# Patient Record
Sex: Male | Born: 1960 | Race: White | Hispanic: No | Marital: Married | Smoking: Former smoker
Health system: Southern US, Community
[De-identification: ages and names within clinical notes are randomized; demographics above are authoritative.]

## PROBLEM LIST (undated history)

## (undated) DIAGNOSIS — K219 Gastro-esophageal reflux disease without esophagitis: Secondary | ICD-10-CM

## (undated) DIAGNOSIS — E785 Hyperlipidemia, unspecified: Secondary | ICD-10-CM

## (undated) DIAGNOSIS — F419 Anxiety disorder, unspecified: Secondary | ICD-10-CM

## (undated) DIAGNOSIS — E559 Vitamin D deficiency, unspecified: Secondary | ICD-10-CM

## (undated) DIAGNOSIS — R0989 Other specified symptoms and signs involving the circulatory and respiratory systems: Secondary | ICD-10-CM

## (undated) DIAGNOSIS — T7840XA Allergy, unspecified, initial encounter: Secondary | ICD-10-CM

## (undated) HISTORY — DX: Other specified symptoms and signs involving the circulatory and respiratory systems: R09.89

## (undated) HISTORY — DX: Allergy, unspecified, initial encounter: T78.40XA

## (undated) HISTORY — PX: HEMORRHOID BANDING: SHX5850

## (undated) HISTORY — DX: Gastro-esophageal reflux disease without esophagitis: K21.9

## (undated) HISTORY — DX: Anxiety disorder, unspecified: F41.9

## (undated) HISTORY — DX: Vitamin D deficiency, unspecified: E55.9

## (undated) HISTORY — DX: Hyperlipidemia, unspecified: E78.5

---

## 2001-10-03 ENCOUNTER — Ambulatory Visit (HOSPITAL_COMMUNITY): Admission: AD | Admit: 2001-10-03 | Discharge: 2001-10-03 | Payer: Self-pay | Admitting: Internal Medicine

## 2001-10-03 ENCOUNTER — Encounter: Payer: Self-pay | Admitting: Internal Medicine

## 2006-08-05 ENCOUNTER — Encounter: Admission: RE | Admit: 2006-08-05 | Discharge: 2006-11-03 | Payer: Self-pay | Admitting: Internal Medicine

## 2007-04-14 HISTORY — PX: LAPAROSCOPIC GASTRIC BANDING: SHX1100

## 2009-01-22 ENCOUNTER — Emergency Department (HOSPITAL_COMMUNITY): Admission: EM | Admit: 2009-01-22 | Discharge: 2009-01-22 | Payer: Self-pay | Admitting: Emergency Medicine

## 2011-08-04 ENCOUNTER — Ambulatory Visit (HOSPITAL_COMMUNITY)
Admission: RE | Admit: 2011-08-04 | Discharge: 2011-08-04 | Disposition: A | Discharge status: Home / Self Care | Payer: Commercial Managed Care - PPO | Source: Ambulatory Visit | Attending: Internal Medicine | Admitting: Internal Medicine

## 2011-08-04 ENCOUNTER — Other Ambulatory Visit (HOSPITAL_COMMUNITY): Payer: Self-pay | Admitting: Internal Medicine

## 2011-08-04 DIAGNOSIS — Z0000 Encounter for general adult medical examination without abnormal findings: Secondary | ICD-10-CM | POA: Insufficient documentation

## 2011-08-04 DIAGNOSIS — R05 Cough: Secondary | ICD-10-CM

## 2011-08-04 DIAGNOSIS — R059 Cough, unspecified: Secondary | ICD-10-CM | POA: Insufficient documentation

## 2011-09-12 HISTORY — PX: HEMORRHOID BANDING: SHX5850

## 2013-04-18 ENCOUNTER — Ambulatory Visit: Payer: Self-pay | Admitting: Emergency Medicine

## 2013-05-18 ENCOUNTER — Encounter: Payer: Self-pay | Admitting: Emergency Medicine

## 2013-05-18 ENCOUNTER — Ambulatory Visit (INDEPENDENT_AMBULATORY_CARE_PROVIDER_SITE_OTHER): Payer: Commercial Managed Care - PPO | Admitting: Emergency Medicine

## 2013-05-18 VITALS — BP 118/80 | HR 68 | Temp 98.0°F | Resp 16 | Ht 72.0 in | Wt 250.0 lb

## 2013-05-18 DIAGNOSIS — E785 Hyperlipidemia, unspecified: Secondary | ICD-10-CM

## 2013-05-18 DIAGNOSIS — M654 Radial styloid tenosynovitis [de Quervain]: Secondary | ICD-10-CM

## 2013-05-18 DIAGNOSIS — Z79899 Other long term (current) drug therapy: Secondary | ICD-10-CM

## 2013-05-18 DIAGNOSIS — E782 Mixed hyperlipidemia: Secondary | ICD-10-CM

## 2013-05-18 DIAGNOSIS — J309 Allergic rhinitis, unspecified: Secondary | ICD-10-CM

## 2013-05-18 LAB — CBC WITH DIFFERENTIAL/PLATELET
Basophils Absolute: 0 10*3/uL (ref 0.0–0.1)
Basophils Relative: 0 % (ref 0–1)
Eosinophils Absolute: 0.2 10*3/uL (ref 0.0–0.7)
Eosinophils Relative: 2 % (ref 0–5)
HCT: 41.5 % (ref 39.0–52.0)
Hemoglobin: 14.5 g/dL (ref 13.0–17.0)
Lymphocytes Relative: 33 % (ref 12–46)
Lymphs Abs: 2.6 10*3/uL (ref 0.7–4.0)
MCH: 29.4 pg (ref 26.0–34.0)
MCHC: 34.9 g/dL (ref 30.0–36.0)
MCV: 84.2 fL (ref 78.0–100.0)
Monocytes Absolute: 0.5 10*3/uL (ref 0.1–1.0)
Monocytes Relative: 6 % (ref 3–12)
Neutro Abs: 4.6 10*3/uL (ref 1.7–7.7)
Neutrophils Relative %: 59 % (ref 43–77)
Platelets: 210 10*3/uL (ref 150–400)
RBC: 4.93 MIL/uL (ref 4.22–5.81)
RDW: 14 % (ref 11.5–15.5)
WBC: 8 10*3/uL (ref 4.0–10.5)

## 2013-05-18 LAB — LIPID PANEL
CHOL/HDL RATIO: 2.8 ratio
Cholesterol: 169 mg/dL (ref 0–200)
HDL: 61 mg/dL (ref >39)
LDL Cholesterol: 96 mg/dL (ref 0–99)
Triglycerides: 60 mg/dL (ref <150)
VLDL: 12 mg/dL (ref 0–40)

## 2013-05-18 LAB — BASIC METABOLIC PANEL WITH GFR
BUN: 17 mg/dL (ref 6–23)
CO2: 29 mEq/L (ref 19–32)
Calcium: 10 mg/dL (ref 8.4–10.5)
Chloride: 102 mEq/L (ref 96–112)
Creat: 0.83 mg/dL (ref 0.50–1.35)
GFR, Est African American: >89 mL/min
GFR, Est Non African American: >89 mL/min
Glucose, Bld: 81 mg/dL (ref 70–99)
Potassium: 4.7 mEq/L (ref 3.5–5.3)
Sodium: 142 mEq/L (ref 135–145)

## 2013-05-18 LAB — HEPATIC FUNCTION PANEL
ALT: 36 U/L (ref 0–53)
AST: 26 U/L (ref 0–37)
Albumin: 4.6 g/dL (ref 3.5–5.2)
Alkaline Phosphatase: 41 U/L (ref 39–117)
Bilirubin, Direct: 0.1 mg/dL (ref 0.0–0.3)
Indirect Bilirubin: 0.4 mg/dL (ref 0.2–1.2)
Total Bilirubin: 0.5 mg/dL (ref 0.2–1.2)
Total Protein: 6.9 g/dL (ref 6.0–8.3)

## 2013-05-18 NOTE — Patient Instructions (Signed)
De Quervain's Disease De Quervain's disease is a condition often seen in racquet sports where there is a soreness (inflammation) in the cord like structures (tendons) which attach muscle to bone on the thumb side of the wrist. There may be a tightening of the tissuesaround the tendons. This condition is often helped by giving up or modifying the activity which caused it. When conservative treatment does not help, surgery may be required. Conservative treatment could include changes in the activity which brought about the problem or made it worse. Anti-inflammatory medications and injections may be used to help decrease the inflammation and help with pain control. Your caregiver will help you determine which is best for you. DIAGNOSIS  Often the diagnosis (learning what is wrong) can be made by examination. Sometimes x-rays are required. HOME CARE INSTRUCTIONS   Apply ice to the sore area for 15-20 minutes, 03-04 times per day while awake. Put the ice in a plastic bag and place a towel between the bag of ice and your skin. This is especially helpful if it can be done after all activities involving the sore wrist.  Temporary splinting may help.  Only take over-the-counter or prescription medicines for pain, discomfort or fever as directed by your caregiver. SEEK MEDICAL CARE IF:   Pain relief is not obtained with medications, or if you have increasing pain and seem to be getting worse rather than better. MAKE SURE YOU:   Understand these instructions.  Will watch your condition.  Will get help right away if you are not doing well or get worse. Document Released: 12/23/2000 Document Revised: 06/22/2011 Document Reviewed: 03/30/2005 [employer] Patient Information 2014 [employer].  

## 2013-05-18 NOTE — Progress Notes (Addendum)
Subjective:    Patient ID: Johnathan Sullivan, male    DOB: 1961/04/10, 53 y.o.   MRN: [id number]  HPI Comments: 53 yo male cholesterol recheck. LAST LABS T 184 L 115  D 83 A1C 5.2  He is exercising routinely. He has been eating smaller portions and improving diet.   He has had sniffles x 3 weeks. He is taking Zyrtec QD.   He notes left wrist has been more painful. He notes weakness and sharp pain on/off with out trigger.   Hyperlipidemia     Medication List       This list is accurate as of: 05/18/13 11:59 PM.  Always use your most recent med list.               aspirin 81 MG tablet  Take 81 mg by mouth daily.     Biotin 5000 MCG Caps  Take 5,000 mcg by mouth daily.     cetirizine 10 MG tablet  Commonly known as:  ZYRTEC  Take 10 mg by mouth daily.     Fish Oil 1200 MG Caps  Take 2,400 mg by mouth 2 (two) times daily.     glucosamine-chondroitin 500-400 MG tablet  Take 2 tablets by mouth daily.     Magnesium 400 MG Caps  Take 2,000 mg by mouth daily.     multivitamin tablet  Take 1 tablet by mouth daily.     RED YEAST RICE PO  Take by mouth daily.     Vitamin D3 5000 UNITS Caps  Take 5,000 Units by mouth daily.       ALLERGIES No Known Allergies    Past Medical History  Diagnosis Date  . Hyperlipidemia   . Unspecified vitamin D deficiency      Review of Systems  HENT: Positive for postnasal drip.   All other systems reviewed and are negative.   BP 118/80  Pulse 68  Temp(Src) 98 F (36.7 C) (Temporal)  Resp 16  Ht 6' (1.829 m)  Wt 250 lb (113.399 kg)  BMI 33.90 kg/m2     Objective:   Physical Exam  Nursing note and vitals reviewed. Constitutional: He is oriented to person, place, and time. He appears well-developed and well-nourished.  HENT:  Head: Normocephalic and atraumatic.  Right Ear: External ear normal.  Left Ear: External ear normal.  Nose: Nose normal.  Mouth/Throat: No oropharyngeal exudate.  Eyes: Conjunctivae and EOM are  normal.  Neck: Normal range of motion. Neck supple. No JVD present. No thyromegaly present.  Cardiovascular: Normal rate, regular rhythm, normal heart sounds and intact distal pulses.   Pulmonary/Chest: Effort normal and breath sounds normal.  Abdominal: Soft. Bowel sounds are normal. He exhibits no distension and no mass. There is no tenderness. There is no rebound and no guarding.  Musculoskeletal: Normal range of motion. He exhibits no edema and no tenderness.  + pain with thumb tuck extension  Lymphadenopathy:    He has no cervical adenopathy.  Neurological: He is alert and oriented to person, place, and time. He has normal reflexes. No cranial nerve deficit. Coordination normal.  Skin: Skin is warm and dry.  Psychiatric: He has a normal mood and affect. His behavior is normal. Judgment and thought content normal.          Assessment & Plan:  1. Cholesterol- recheck labs, Need to eat healthier and exercise AD. 2. Allergic rhinitis- Switch to Allegra OTC, increase H2o, allergy hygiene explained. 3. Berline Lopes Disease-  Declines steroid, ice, and can try wrist splint, w/c if SX increase. Voltaren gel SX #1 use AD

## 2013-05-21 ENCOUNTER — Encounter: Payer: Self-pay | Admitting: Emergency Medicine

## 2013-05-21 DIAGNOSIS — E559 Vitamin D deficiency, unspecified: Secondary | ICD-10-CM | POA: Insufficient documentation

## 2013-05-21 DIAGNOSIS — E785 Hyperlipidemia, unspecified: Secondary | ICD-10-CM | POA: Insufficient documentation

## 2013-08-08 ENCOUNTER — Encounter: Payer: Self-pay | Admitting: Emergency Medicine

## 2013-08-08 ENCOUNTER — Ambulatory Visit (INDEPENDENT_AMBULATORY_CARE_PROVIDER_SITE_OTHER): Payer: Commercial Managed Care - PPO | Admitting: Emergency Medicine

## 2013-08-08 VITALS — BP 124/82 | HR 68 | Temp 98.0°F | Resp 18 | Ht 71.5 in | Wt 247.0 lb

## 2013-08-08 DIAGNOSIS — Z0000 Encounter for general adult medical examination without abnormal findings: Secondary | ICD-10-CM

## 2013-08-08 DIAGNOSIS — Z1212 Encounter for screening for malignant neoplasm of rectum: Secondary | ICD-10-CM

## 2013-08-08 DIAGNOSIS — E782 Mixed hyperlipidemia: Secondary | ICD-10-CM

## 2013-08-08 DIAGNOSIS — M25532 Pain in left wrist: Secondary | ICD-10-CM

## 2013-08-08 DIAGNOSIS — Z111 Encounter for screening for respiratory tuberculosis: Secondary | ICD-10-CM

## 2013-08-08 DIAGNOSIS — E559 Vitamin D deficiency, unspecified: Secondary | ICD-10-CM

## 2013-08-08 DIAGNOSIS — E785 Hyperlipidemia, unspecified: Secondary | ICD-10-CM

## 2013-08-08 DIAGNOSIS — I10 Essential (primary) hypertension: Secondary | ICD-10-CM

## 2013-08-08 LAB — CBC WITH DIFFERENTIAL/PLATELET
Basophils Absolute: 0 10*3/uL (ref 0.0–0.1)
Basophils Relative: 0 % (ref 0–1)
EOS ABS: 0.2 10*3/uL (ref 0.0–0.7)
Eosinophils Relative: 3 % (ref 0–5)
HEMATOCRIT: 42 % (ref 39.0–52.0)
Hemoglobin: 14.8 g/dL (ref 13.0–17.0)
Lymphocytes Relative: 31 % (ref 12–46)
Lymphs Abs: 2.5 10*3/uL (ref 0.7–4.0)
MCH: 30.3 pg (ref 26.0–34.0)
MCHC: 35.2 g/dL (ref 30.0–36.0)
MCV: 85.9 fL (ref 78.0–100.0)
Monocytes Absolute: 0.4 10*3/uL (ref 0.1–1.0)
Monocytes Relative: 5 % (ref 3–12)
Neutro Abs: 4.9 10*3/uL (ref 1.7–7.7)
Neutrophils Relative %: 61 % (ref 43–77)
Platelets: 230 10*3/uL (ref 150–400)
RBC: 4.89 MIL/uL (ref 4.22–5.81)
RDW: 13.7 % (ref 11.5–15.5)
WBC: 8 10*3/uL (ref 4.0–10.5)

## 2013-08-08 LAB — HEMOGLOBIN A1C
HEMOGLOBIN A1C: 5.3 % (ref <5.7)
MEAN PLASMA GLUCOSE: 105 mg/dL (ref <117)

## 2013-08-08 NOTE — Progress Notes (Signed)
Subjective:    Patient ID: Johnathan Sullivan, male    DOB: May 28, 1960, 53 y.o.   MRN: [id number]  HPI Comments: 53 yo overweight WM for CPE and 3 month Cholesterol/ Vit D f/u. He has not been exercising routinely but keeps busy. He has not been eating as healthy. He is otherwise doing well.   Last labs  CHOL         169   05/18/2013 HDL           61   05/18/2013 LDLCALC       96   05/18/2013 TRIG          60   05/18/2013 CHOLHDL      2.8   05/18/2013 ALT           36   05/18/2013 AST           26   05/18/2013 ALKPHOS       41   05/18/2013 BILITOT      0.5   05/18/2013 CREATININE     0.83   05/18/2013 BUN              17   05/18/2013 NA              142   05/18/2013 K               4.7   05/18/2013 CL              102   05/18/2013 CO2              29   05/18/2013 WBC      8.0   05/18/2013 HGB     14.5   05/18/2013 HCT     41.5   05/18/2013 MCV     84.2   05/18/2013 PLT      210   05/18/2013 VIT D 83 A1C 5.2 TESTOSTERONE 308 PSA .79  Left wrist still with discomfort weakness. He notes splint made symptoms worse, voltaren helped some. He notes on/off discomfort usually with weather and certain positions.     Medication List       This list is accurate as of: 08/08/13 11:59 PM.  Always use your most recent med list.               aspirin 81 MG tablet  Take 81 mg by mouth daily.     Biotin 5000 MCG Caps  Take 5,000 mcg by mouth daily.     cetirizine 10 MG tablet  Commonly known as:  ZYRTEC  Take 10 mg by mouth daily.     Fish Oil 1200 MG Caps  Take 2,400 mg by mouth 2 (two) times daily.     glucosamine-chondroitin 500-400 MG tablet  Take 2 tablets by mouth daily.     Magnesium 400 MG Caps  Take 2,000 mg by mouth daily.     multivitamin tablet  Take 1 tablet by mouth daily.     RED YEAST RICE PO  Take by mouth daily.     Vitamin D3 5000 UNITS Caps  Take 5,000 Units by mouth daily.      MAINTENANCE: Colonoscopy:2013 EYE:3/14 Dentist:Q 6 month, 3/ 15  CXR:07/2011  IMMUNIZATIONS: Tdap:  08/09/11 Influenza: 2014  Patient Care Team: Lucky CowboyWilliam McKeown, MD as PCP - General (Internal Medicine) Elmon ElseKaren Gould, MD as Consulting Physician (Dermatology) Davina PokePeter K Dunn (Optometry) Griffith CitronJeffrey R Medoff, MD as Consulting Physician (Gastroenterology) Judene CompanionAlfred B Little, MD as  Attending Physician (Cardiology) Nedra HaiLee, (Dentist)    Review of Systems  Musculoskeletal: Positive for arthralgias and joint swelling.  All other systems reviewed and are negative.  BP 124/82  Pulse 68  Temp(Src) 98 F (36.7 C) (Temporal)  Resp 18  Ht 5' 11.5" (1.816 m)  Wt 247 lb (112.038 kg)  BMI 33.97 kg/m2     Objective:   Physical Exam  Nursing note and vitals reviewed. Constitutional: He is oriented to person, place, and time. He appears well-developed and well-nourished.  Overweight  HENT:  Head: Normocephalic and atraumatic.  Right Ear: External ear normal.  Left Ear: External ear normal.  Nose: Nose normal.  Mouth/Throat: Oropharynx is clear and moist.  Eyes: Conjunctivae and EOM are normal. Pupils are equal, round, and reactive to light. Right eye exhibits no discharge. Left eye exhibits no discharge. No scleral icterus.  Neck: Normal range of motion. Neck supple. No JVD present. No tracheal deviation present. No thyromegaly present.  Cardiovascular: Normal rate, regular rhythm, normal heart sounds and intact distal pulses.   Varicose veins bil LE- NT soft, 1 + edema  Pulmonary/Chest: Effort normal and breath sounds normal.  Abdominal: Soft. Bowel sounds are normal. He exhibits no distension and no mass. There is no tenderness. There is no rebound and no guarding.  Genitourinary: Rectum normal and prostate normal. Guaiac negative stool.  External NT hemorrhoid  Musculoskeletal: Normal range of motion. He exhibits no edema and no tenderness.  + pain left wrist/ thumb with lateral flexion  Lymphadenopathy:    He has no cervical adenopathy.  Neurological: He is alert and oriented to person, place, and  time. He has normal reflexes. No cranial nerve deficit. He exhibits normal muscle tone. Coordination normal.  Skin: Skin is warm and dry. No rash noted. No erythema. No pallor.  Psychiatric: He has a normal mood and affect. His behavior is normal. Judgment and thought content normal.      AORTA SCAN WNL EKG NSCSPT     Assessment & Plan:  1. CPE- Update screening labs/ History/ Immunizations/ Testing as needed. Advised healthy diet, QD exercise, increase H20 and continue RX/ Vitamins AD.  2. Varicose veins- advise support hose, elevation, increase activity, call with any concerns .  3. Overweight/ Vit D Def./Cholesterol- recheck labs, Need to eat healthier and exercise AD.  4. Left wrist pain- ref ORTHO, R.I.C.E call with any concerns

## 2013-08-08 NOTE — Patient Instructions (Addendum)
Wrist Pain A wrist sprain happens when the bands of tissue that hold the wrist joints together (ligament) stretch too much or tear. A wrist strain happens when muscles or bands of tissue that connect muscles to bones (tendons) are stretched or pulled. HOME CARE  Put ice on the injured area.  Put ice in a plastic bag.  Place a towel between your skin and the bag.  Leave the ice on for 15-20 minutes, 03-04 times a day, for the first 2 days.  Raise (elevate) the injured wrist to lessen puffiness (swelling).  Rest the injured wrist for at least 48 hours or as told by your doctor.  Wear a splint, cast, or an elastic wrap as told by your doctor.  Only take medicine as told by your doctor.  Follow up with your doctor as told. This is important. GET HELP RIGHT AWAY IF:   The fingers are puffy, very red, white, or cold and blue.  The fingers lose feeling (numb) or tingle.  The pain gets worse.  It is hard to move the fingers. MAKE SURE YOU:   Understand these instructions.  Will watch your condition.  Will get help right away if you are not doing well or get worse. Document Released: 09/16/2007 Document Revised: 06/22/2011 Document Reviewed: 05/21/2010 [facility] LtdExitCare Patient Information 2014 [place] LakeExitCare, [place]LL[place]. De Quervain's Disease Suzette BattiestDe Quervain's disease is a condition often seen in racquet sports where there is a soreness (inflammation) in the cord like structures (tendons) which attach muscle to bone on the thumb side of the wrist. There may be a tightening of the tissuesaround the tendons. This condition is often helped by giving up or modifying the activity which caused it. When conservative treatment does not help, surgery may be required. Conservative treatment could include changes in the activity which brought about the problem or made it worse. Anti-inflammatory medications and injections may be used to help decrease the inflammation and help with pain control. Your caregiver will help  you determine which is best for you. DIAGNOSIS  Often the diagnosis (learning what is wrong) can be made by examination. Sometimes x-rays are required. HOME CARE INSTRUCTIONS   Apply ice to the sore area for 15-20 minutes, 03-04 times per day while awake. Put the ice in a plastic bag and place a towel between the bag of ice and your skin. This is especially helpful if it can be done after all activities involving the sore wrist.  Temporary splinting may help.  Only take over-the-counter or prescription medicines for pain, discomfort or fever as directed by your caregiver. SEEK MEDICAL CARE IF:   Pain relief is not obtained with medications, or if you have increasing pain and seem to be getting worse rather than better. MAKE SURE YOU:   Understand these instructions.  Will watch your condition.  Will get help right away if you are not doing well or get worse. Document Released: 12/23/2000 Document Revised: 06/22/2011 Document Reviewed: 03/30/2005 [facility]Exit[facility] Patient Information 2014 [place]Care, [place]LL[place].

## 2013-08-09 LAB — TESTOSTERONE: Testosterone: 362 ng/dL (ref 300–890)

## 2013-08-09 LAB — HEPATIC FUNCTION PANEL
ALBUMIN: 4.5 g/dL (ref 3.5–5.2)
ALK PHOS: 41 U/L (ref 39–117)
ALT: 28 U/L (ref 0–53)
AST: 22 U/L (ref 0–37)
BILIRUBIN TOTAL: 0.5 mg/dL (ref 0.2–1.2)
Bilirubin, Direct: 0.1 mg/dL (ref 0.0–0.3)
Indirect Bilirubin: 0.4 mg/dL (ref 0.2–1.2)
Total Protein: 6.9 g/dL (ref 6.0–8.3)

## 2013-08-09 LAB — LIPID PANEL
CHOLESTEROL: 151 mg/dL (ref 0–200)
HDL: 57 mg/dL (ref >39)
LDL Cholesterol: 82 mg/dL (ref 0–99)
Total CHOL/HDL Ratio: 2.6 Ratio
Triglycerides: 62 mg/dL (ref <150)
VLDL: 12 mg/dL (ref 0–40)

## 2013-08-09 LAB — VITAMIN D 25 HYDROXY (VIT D DEFICIENCY, FRACTURES): Vit D, 25-Hydroxy: 74 ng/mL (ref 30–89)

## 2013-08-09 LAB — BASIC METABOLIC PANEL WITH GFR
BUN: 15 mg/dL (ref 6–23)
CHLORIDE: 102 meq/L (ref 96–112)
CO2: 28 mEq/L (ref 19–32)
Calcium: 9.6 mg/dL (ref 8.4–10.5)
Creat: 0.97 mg/dL (ref 0.50–1.35)
GFR, Est African American: >89 mL/min
GFR, Est Non African American: 89 mL/min
Glucose, Bld: 83 mg/dL (ref 70–99)
Potassium: 4.4 mEq/L (ref 3.5–5.3)
SODIUM: 140 meq/L (ref 135–145)

## 2013-08-09 LAB — URINALYSIS, ROUTINE W REFLEX MICROSCOPIC
Bilirubin Urine: NEGATIVE
Glucose, UA: NEGATIVE mg/dL
Hgb urine dipstick: NEGATIVE
KETONES UR: NEGATIVE mg/dL
Leukocytes, UA: NEGATIVE
Nitrite: NEGATIVE
PROTEIN: NEGATIVE mg/dL
SPECIFIC GRAVITY, URINE: 1.009 (ref 1.005–1.030)
UROBILINOGEN UA: 0.2 mg/dL (ref 0.0–1.0)
pH: 7 (ref 5.0–8.0)

## 2013-08-09 LAB — INSULIN, FASTING: Insulin fasting, serum: 12 u[IU]/mL (ref 3–28)

## 2013-08-09 LAB — MAGNESIUM: MAGNESIUM: 1.9 mg/dL (ref 1.5–2.5)

## 2013-08-09 LAB — TSH: TSH: 1.118 u[IU]/mL (ref 0.350–4.500)

## 2013-08-09 LAB — MICROALBUMIN / CREATININE URINE RATIO
CREATININE, URINE: 75.7 mg/dL
Microalb Creat Ratio: 6.6 mg/g (ref 0.0–30.0)
Microalb, Ur: 0.5 mg/dL (ref 0.00–1.89)

## 2013-08-09 LAB — PSA: PSA: 0.91 ng/mL (ref <=4.00)

## 2013-08-10 LAB — TB SKIN TEST
INDURATION: 0 mm
TB Skin Test: NEGATIVE

## 2013-08-24 ENCOUNTER — Encounter: Payer: Self-pay | Admitting: Emergency Medicine

## 2013-09-19 DIAGNOSIS — R0989 Other specified symptoms and signs involving the circulatory and respiratory systems: Secondary | ICD-10-CM | POA: Insufficient documentation

## 2013-09-19 DIAGNOSIS — T7840XA Allergy, unspecified, initial encounter: Secondary | ICD-10-CM | POA: Insufficient documentation

## 2013-10-25 ENCOUNTER — Encounter: Payer: Self-pay | Admitting: Emergency Medicine

## 2014-02-01 ENCOUNTER — Ambulatory Visit (INDEPENDENT_AMBULATORY_CARE_PROVIDER_SITE_OTHER): Payer: Commercial Managed Care - PPO

## 2014-02-01 VITALS — Temp 98.1°F

## 2014-02-01 DIAGNOSIS — Z23 Encounter for immunization: Secondary | ICD-10-CM

## 2014-04-02 ENCOUNTER — Ambulatory Visit (HOSPITAL_COMMUNITY)
Admission: RE | Admit: 2014-04-02 | Discharge: 2014-04-02 | Disposition: A | Discharge status: Home / Self Care | Payer: Commercial Managed Care - PPO | Source: Ambulatory Visit | Attending: Physician Assistant | Admitting: Physician Assistant

## 2014-04-02 ENCOUNTER — Ambulatory Visit (INDEPENDENT_AMBULATORY_CARE_PROVIDER_SITE_OTHER): Payer: Commercial Managed Care - PPO | Admitting: Physician Assistant

## 2014-04-02 ENCOUNTER — Encounter: Payer: Self-pay | Admitting: Physician Assistant

## 2014-04-02 VITALS — BP 126/80 | HR 68 | Temp 98.0°F | Resp 18 | Ht 72.0 in | Wt 252.0 lb

## 2014-04-02 DIAGNOSIS — R05 Cough: Secondary | ICD-10-CM | POA: Diagnosis not present

## 2014-04-02 DIAGNOSIS — R059 Cough, unspecified: Secondary | ICD-10-CM

## 2014-04-02 DIAGNOSIS — R0989 Other specified symptoms and signs involving the circulatory and respiratory systems: Secondary | ICD-10-CM | POA: Insufficient documentation

## 2014-04-02 DIAGNOSIS — J209 Acute bronchitis, unspecified: Secondary | ICD-10-CM

## 2014-04-02 MED ORDER — AZITHROMYCIN 250 MG PO TABS: ORAL_TABLET | ORAL | Status: AC

## 2014-04-02 MED ORDER — ALBUTEROL SULFATE 108 (90 BASE) MCG/ACT IN AEPB: 2.0000 | INHALATION_SPRAY | Freq: Four times a day (QID) | RESPIRATORY_TRACT | Status: DC | PRN

## 2014-04-02 MED ORDER — PROMETHAZINE-CODEINE 6.25-10 MG/5ML PO SYRP: 5.0000 mL | ORAL_SOLUTION | Freq: Four times a day (QID) | ORAL | Status: DC | PRN

## 2014-04-02 MED ORDER — PREDNISONE 20 MG PO TABS: ORAL_TABLET | ORAL | Status: AC

## 2014-04-02 NOTE — Progress Notes (Signed)
Subjective:    Patient ID: Guss Bundehorn W Cull, male    DOB: [birth year], 53 y.o.   MRN: [id number]  Cough This is a new problem. Episode onset: Last Wednesday. The problem has been unchanged. The problem occurs constantly. Cough characteristics: was yellow-green earlier, but now non-productive. Associated symptoms include chills, headaches, rhinorrhea and wheezing. Pertinent negatives include no chest pain, ear pain, fever, postnasal drip, rash, sore throat or shortness of breath. The symptoms are aggravated by lying down. Risk factors: Former Smoker- Quit 05/18/93- 2 ppd since Age teenager (About 15 years total smoking). Treatments tried: Delsym and cold medicines. The treatment provided no relief.  GFR= 89 on 08/08/13 Review of Systems  Constitutional: Positive for chills and diaphoresis. Negative for fever and fatigue.  HENT: Positive for congestion, rhinorrhea and sinus pressure. Negative for ear discharge, ear pain, postnasal drip, sore throat and trouble swallowing.   Eyes: Negative.   Respiratory: Positive for cough, chest tightness and wheezing. Negative for shortness of breath.   Cardiovascular: Negative.  Negative for chest pain.  Gastrointestinal: Negative.   Musculoskeletal:       Muscle Aches  Skin: Negative.  Negative for rash.  Neurological: Positive for headaches. Negative for dizziness and light-headedness.   Past Medical History  Diagnosis Date  . Hyperlipidemia   . Unspecified vitamin D deficiency   . Labile hypertension   . Allergy    Current Outpatient Prescriptions on File Prior to Visit  Medication Sig Dispense Refill  . aspirin 81 MG tablet Take 81 mg by mouth daily.    . Biotin 5000 MCG CAPS Take 5,000 mcg by mouth daily.    . cetirizine (ZYRTEC) 10 MG tablet Take 10 mg by mouth daily.    . Cholecalciferol (VITAMIN D3) 5000 UNITS CAPS Take 5,000 Units by mouth daily.    Marland Kitchen. glucosamine-chondroitin 500-400 MG tablet Take 2 tablets by mouth daily.    . Magnesium 400 MG  CAPS Take 2,000 mg by mouth daily.    . Multiple Vitamin (MULTIVITAMIN) tablet Take 1 tablet by mouth daily.    . Omega-3 Fatty Acids (FISH OIL) 1200 MG CAPS Take 2,400 mg by mouth 2 (two) times daily.    . Red Yeast Rice Extract (RED YEAST RICE PO) Take by mouth daily.     No current facility-administered medications on file prior to visit.   No Known Allergies    BP 126/80 mmHg  Pulse 68  Temp(Src) 98 F (36.7 C) (Temporal)  Resp 18  Ht 6' (1.829 m)  Wt 252 lb (114.306 kg)  BMI 34.17 kg/m2  SpO2 97% Wt Readings from Last 3 Encounters:  04/02/14 252 lb (114.306 kg)  08/08/13 247 lb (112.038 kg)  05/18/13 250 lb (113.399 kg)   Objective:   Physical Exam  Constitutional: He is oriented to person, place, and time. He appears well-developed and well-nourished. He has a sickly appearance. No distress.  HENT:  Head: Normocephalic.  Right Ear: Tympanic membrane, external ear and ear canal normal.  Left Ear: Tympanic membrane, external ear and ear canal normal.  Nose: Nose normal. Right sinus exhibits no maxillary sinus tenderness and no frontal sinus tenderness. Left sinus exhibits no maxillary sinus tenderness and no frontal sinus tenderness.  Mouth/Throat: Uvula is midline and mucous membranes are normal. Mucous membranes are not pale and not dry. No trismus in the jaw. No uvula swelling. Posterior oropharyngeal erythema present. No oropharyngeal exudate, posterior oropharyngeal edema or tonsillar abscesses.  Eyes: Conjunctivae and lids are normal. Pupils  are equal, round, and reactive to light. Right eye exhibits no discharge. Left eye exhibits no discharge. No scleral icterus.  Neck: Trachea normal, normal range of motion and phonation normal. Neck supple. No tracheal tenderness present. No tracheal deviation present.  Cardiovascular: Normal rate, regular rhythm, S1 normal, S2 normal, normal heart sounds and normal pulses.  Exam reveals no gallop, no distant heart sounds and no  friction rub.   No murmur heard. Pulmonary/Chest: Effort normal. No stridor. No respiratory distress. He has no decreased breath sounds. He has wheezes. He has no rhonchi. He has rales in the right middle field, the right lower field and the left middle field. He exhibits no tenderness.  Wheezing throughout all lung fields.   Abdominal: Soft. Bowel sounds are normal. There is no tenderness. There is no rebound and no guarding.  Lymphadenopathy:  No tenderness or LAD.  Neurological: He is alert and oriented to person, place, and time. Gait normal.  Skin: Skin is warm, dry and intact. No rash noted. He is not diaphoretic.  Psychiatric: He has a normal mood and affect. His speech is normal and behavior is normal. Judgment and thought content normal. Cognition and memory are normal.  Vitals reviewed.  Assessment & Plan:  1. Acute bronchitis, unspecified organism -Take Z-Pak as prescribed- azithromycin (ZITHROMAX Z-PAK) 250 MG tablet; Take 2 tablets PO on day 1, then take 1 tablet PO QDaily for 4 days.  Dispense: 6 tablet; Refill: 1 -Take Prednisone as prescribed for inflammation- predniSONE (DELTASONE) 20 MG tablet; Take 3 tablets PO QDaily for 3 days, then take 2 tablets PO QDaily for 3 days, then take 1 tablet PO QDaily for 3 days  Dispense: 18 tablet; Refill: 0 - Take Promethazine-Codeine as prescribed for cough- promethazine-codeine (PHENERGAN WITH CODEINE) 6.25-10 MG/5ML syrup; Take 5 mLs by mouth every 6 (six) hours as needed for cough. Max: 20mL per day  Dispense: 240 mL; Refill: 0 -Take Proair Respiclick as prescribed for wheezing- Coupon given - Albuterol Sulfate (PROAIR RESPICLICK) 108 (90 BASE) MCG/ACT AEPB; Inhale 2 puffs into the lungs every 6 (six) hours as needed.  Dispense: 1 each; Refill: 1  2. Cough Ordered x-ray to R/O Pneumonia - DG Chest 2 View; Future  Discussed medication effects and SE's.  Pt agreed to treatment plan. If you are not feeling better in 10-14 days, then  please call the office. Please follow up in 1 month for regular follow up.  Addendum: Chest x-ray showed "The lungs are mildly hyperinflated without evidence of pneumonia nor CHF nor other acute cardiopulmonary abnormality."  "There is mild stable tortuosity of the descending thoracic aorta. There is mild disc space narrowing in the lower thoracic spine."  Will Monitor.  Please continue medications as prescribed at visit.  Mearl Olver, Lise AuerJennifer L, PA-C 9:06 AM [facility]

## 2014-04-02 NOTE — Patient Instructions (Addendum)
-   Rest and stay hydrated.  Make sure you drink plenty of fluids to make sure urine is clear when you urinate.  Water will help thin out mucous. -Take Z-Pak as prescribed with 1 refill. -Take Prednisone as prescribed for inflammation. -Take Promethazine-codeine as prescribed for cough. Take Proair Respiclick for wheezing as prescribed. -[facility]  If you have pneumonia, please follow up in 1 week. If you are not feeling better in 10-14 days, then please call the office.  Please schedule regular follow up in 1 month.  Please call the office or message through My Chart if you have any questions.   Acute Bronchitis Bronchitis is when the airways that extend from the windpipe into the lungs get red, puffy, and painful (inflamed). Bronchitis often causes thick spit (mucus) to develop. This leads to a cough. A cough is the most common symptom of bronchitis. In acute bronchitis, the condition usually begins suddenly and goes away over time (usually in 2 weeks). Smoking, allergies, and asthma can make bronchitis worse. Repeated episodes of bronchitis may cause more lung problems.  Most common cause of Bronchitis is viruses (rhinovirus, coronavirus, RSV).  Therefore, not requiring an antibiotic; as antibiotics only treat bacterial infections.  HOME CARE  Rest.  Drink enough fluids to keep your pee (urine) clear or pale yellow (unless you need to limit fluids as told by your doctor).  Only take over-the-counter or prescription medicines as told by your doctor.  Avoid smoking and secondhand smoke. These can make bronchitis worse. If you are a smoker, think about using nicotine gum or skin patches. Quitting smoking will help your lungs heal faster.  Reduce the chance of getting bronchitis again by:  Washing your hands often.  Avoiding people with cold symptoms.  Trying not to touch your hands to your mouth, nose, or eyes.  Follow up with your doctor as  told. GET HELP IF: Your symptoms do not improve after 1 week of treatment. Symptoms include:  Cough.  Fever.  Coughing up thick spit.  Body aches.  Chest congestion.  Chills.  Shortness of breath.  Sore throat. GET HELP RIGHT AWAY IF:   You have an increased fever.  You have chills.  You have severe shortness of breath.  You have bloody thick spit (sputum).  You throw up (vomit) often.  You lose too much body fluid (dehydration).  You have a severe headache.  You faint. MAKE SURE YOU:   Understand these instructions.  Will watch your condition.  Will get help right away if you are not doing well or get worse. Document Released: 09/16/2007 Document Revised: 11/30/2012 Document Reviewed: 09/20/2012 [facility]Exit[facility] Patient Information 2015 [place]ExitCare, [place]LLC. This information is not intended to replace advice given to you by your health care provider. Make sure you discuss any questions you have with your health care provider.

## 2014-05-08 ENCOUNTER — Encounter: Payer: Self-pay | Admitting: Internal Medicine

## 2014-05-15 ENCOUNTER — Ambulatory Visit: Payer: Self-pay | Admitting: Internal Medicine

## 2014-08-16 ENCOUNTER — Encounter: Payer: Self-pay | Admitting: Emergency Medicine

## 2014-08-29 ENCOUNTER — Encounter: Payer: Self-pay | Admitting: Internal Medicine

## 2014-09-18 ENCOUNTER — Encounter: Payer: Self-pay | Admitting: Internal Medicine

## 2014-09-18 ENCOUNTER — Ambulatory Visit (INDEPENDENT_AMBULATORY_CARE_PROVIDER_SITE_OTHER): Payer: Commercial Managed Care - PPO | Admitting: Internal Medicine

## 2014-09-18 VITALS — BP 108/72 | HR 54 | Temp 98.6°F | Resp 18 | Ht 71.5 in | Wt 258.0 lb

## 2014-09-18 DIAGNOSIS — R7309 Other abnormal glucose: Secondary | ICD-10-CM

## 2014-09-18 DIAGNOSIS — I10 Essential (primary) hypertension: Secondary | ICD-10-CM

## 2014-09-18 DIAGNOSIS — Z0001 Encounter for general adult medical examination with abnormal findings: Secondary | ICD-10-CM

## 2014-09-18 DIAGNOSIS — Z79899 Other long term (current) drug therapy: Secondary | ICD-10-CM | POA: Diagnosis not present

## 2014-09-18 DIAGNOSIS — E785 Hyperlipidemia, unspecified: Secondary | ICD-10-CM | POA: Diagnosis not present

## 2014-09-18 DIAGNOSIS — R5383 Other fatigue: Secondary | ICD-10-CM

## 2014-09-18 DIAGNOSIS — R0989 Other specified symptoms and signs involving the circulatory and respiratory systems: Secondary | ICD-10-CM

## 2014-09-18 DIAGNOSIS — Z1212 Encounter for screening for malignant neoplasm of rectum: Secondary | ICD-10-CM

## 2014-09-18 DIAGNOSIS — R6889 Other general symptoms and signs: Secondary | ICD-10-CM

## 2014-09-18 DIAGNOSIS — M25532 Pain in left wrist: Secondary | ICD-10-CM | POA: Diagnosis not present

## 2014-09-18 DIAGNOSIS — E559 Vitamin D deficiency, unspecified: Secondary | ICD-10-CM | POA: Diagnosis not present

## 2014-09-18 DIAGNOSIS — Z125 Encounter for screening for malignant neoplasm of prostate: Secondary | ICD-10-CM | POA: Diagnosis not present

## 2014-09-18 LAB — CBC WITH DIFFERENTIAL/PLATELET
BASOS ABS: 0.1 10*3/uL (ref 0.0–0.1)
Basophils Relative: 1 % (ref 0–1)
Eosinophils Absolute: 0.3 10*3/uL (ref 0.0–0.7)
Eosinophils Relative: 4 % (ref 0–5)
HCT: 45.4 % (ref 39.0–52.0)
Hemoglobin: 15.5 g/dL (ref 13.0–17.0)
Lymphocytes Relative: 27 % (ref 12–46)
Lymphs Abs: 2.1 10*3/uL (ref 0.7–4.0)
MCH: 29.9 pg (ref 26.0–34.0)
MCHC: 34.1 g/dL (ref 30.0–36.0)
MCV: 87.5 fL (ref 78.0–100.0)
MPV: 10.6 fL (ref 8.6–12.4)
Monocytes Absolute: 0.5 10*3/uL (ref 0.1–1.0)
Monocytes Relative: 6 % (ref 3–12)
NEUTROS PCT: 62 % (ref 43–77)
Neutro Abs: 4.7 10*3/uL (ref 1.7–7.7)
PLATELETS: 224 10*3/uL (ref 150–400)
RBC: 5.19 MIL/uL (ref 4.22–5.81)
RDW: 13.8 % (ref 11.5–15.5)
WBC: 7.6 10*3/uL (ref 4.0–10.5)

## 2014-09-18 LAB — MAGNESIUM: MAGNESIUM: 2 mg/dL (ref 1.5–2.5)

## 2014-09-18 LAB — HEPATIC FUNCTION PANEL
ALT: 22 U/L (ref 0–53)
AST: 19 U/L (ref 0–37)
Albumin: 4.2 g/dL (ref 3.5–5.2)
Alkaline Phosphatase: 52 U/L (ref 39–117)
BILIRUBIN TOTAL: 0.4 mg/dL (ref 0.2–1.2)
Bilirubin, Direct: 0.1 mg/dL (ref 0.0–0.3)
Indirect Bilirubin: 0.3 mg/dL (ref 0.2–1.2)
TOTAL PROTEIN: 7.1 g/dL (ref 6.0–8.3)

## 2014-09-18 LAB — BASIC METABOLIC PANEL WITH GFR
BUN: 14 mg/dL (ref 6–23)
CHLORIDE: 103 meq/L (ref 96–112)
CO2: 28 meq/L (ref 19–32)
Calcium: 9.8 mg/dL (ref 8.4–10.5)
Creat: 0.88 mg/dL (ref 0.50–1.35)
GFR, Est Non African American: >89 mL/min
GLUCOSE: 88 mg/dL (ref 70–99)
Potassium: 4.6 mEq/L (ref 3.5–5.3)
SODIUM: 139 meq/L (ref 135–145)

## 2014-09-18 LAB — IRON AND TIBC
%SAT: 16 % — ABNORMAL LOW (ref 20–55)
Iron: 62 ug/dL (ref 42–165)
TIBC: 378 ug/dL (ref 215–435)
UIBC: 316 ug/dL (ref 125–400)

## 2014-09-18 LAB — LIPID PANEL
CHOL/HDL RATIO: 3.4 ratio
CHOLESTEROL: 180 mg/dL (ref 0–200)
HDL: 53 mg/dL (ref >=40)
LDL CALC: 114 mg/dL — AB (ref 0–99)
Triglycerides: 63 mg/dL (ref <150)
VLDL: 13 mg/dL (ref 0–40)

## 2014-09-18 LAB — VITAMIN B12: VITAMIN B 12: 783 pg/mL (ref 211–911)

## 2014-09-18 LAB — HEMOGLOBIN A1C
Hgb A1c MFr Bld: 5.3 % (ref <5.7)
MEAN PLASMA GLUCOSE: 105 mg/dL (ref <117)

## 2014-09-18 LAB — TSH: TSH: 0.787 u[IU]/mL (ref 0.350–4.500)

## 2014-09-18 MED ORDER — MELOXICAM 15 MG PO TABS: 15.0000 mg | ORAL_TABLET | Freq: Every day | ORAL | Status: DC

## 2014-09-18 NOTE — Progress Notes (Signed)
Patient ID: ALVY ALSOP, male   DOB: [birth year], 54 y.o.   MRN: [id number]  Complete Physical  Assessment and Plan:   1. Labile hypertension -cont to watch at home -diet and exercise - Urinalysis, Routine w reflex microscopic (not at [facility]) - Microalbumin / creatinine urine ratio - EKG 12-Lead  2. Vitamin D deficiency -cont supplement - Vit D  25 hydroxy (rtn osteoporosis monitoring)  3. Hyperlipidemia -check today.  If elevated consider red yeast rice - Lipid panel  4. Elevated random blood glucose level  - Hemoglobin A1c - Insulin, random  5. Screening for rectal cancer  - POC Hemoccult Bld/Stl (3-Cd Home Screen); Future  6. Prostate cancer screening  - PSA  7. Other fatigue  - Iron and TIBC - Vitamin B12 - Testosterone - TSH  8. Medication management  - CBC with Differential/Platelet - BASIC METABOLIC PANEL WITH GFR - Hepatic function panel - Magnesium  9. Left wrist pain -CMC arthiritis vs dequervains tenosynovitis - meloxicam (MOBIC) 15 MG tablet; Take 1 tablet (15 mg total) by mouth daily.  Dispense: 30 tablet; Refill: 1 - Ambulatory referral to Orthopedics    Discussed med's effects and SE's. Screening labs and tests as requested with regular follow-up as recommended.  HPI Patient presents for a complete physical.   His blood pressure has been controlled at home, today their BP is BP: 108/72 mmHg He does not workout. He denies chest pain, shortness of breath, dizziness.   He is not on cholesterol medication and denies myalgias. His cholesterol is not at goal. The cholesterol last visit was:   Lab Results  Component Value Date   CHOL 151 08/08/2013   HDL 57 08/08/2013   LDLCALC 82 08/08/2013   TRIG 62 08/08/2013   CHOLHDL 2.6 08/08/2013    He has not been working on diet and exercise for prediabetes, he is on bASA, he is not on ACE/ARB and denies foot ulcerations, hyperglycemia, hypoglycemia , increased appetite, nausea, paresthesia of the  feet, polydipsia, polyuria, visual disturbances, vomiting and weight loss. Last A1C in the office was:  Lab Results  Component Value Date   HGBA1C 5.3 08/08/2013    Patient is on Vitamin D supplement.   Lab Results  Component Value Date   VD25OH 74 08/08/2013      Last PSA was: Lab Results  Component Value Date   PSA 0.91 08/08/2013  .  Denies BPH symptoms daytime frequency, double voiding, dysuria, hematuria, hesitancy, incontinence, intermittency, nocturia, sensation of incomplete bladder emptying, suprapubic pain, urgency or weak urinary stream.  Patient complains of some left hand pain that has been going on for a year.  He reports that it is worse with turning the handle and also with opening jars and gripping objects.  He was supposed to see a hand Dr. But never heard anything from our office.  He did try wearing a wrist splint.  He does occasionally take medication to help with his hand like advil which helps a little bit.  He is right hand dominant.      Current Medications:  Current Outpatient Prescriptions on File Prior to Visit  Medication Sig Dispense Refill  . aspirin 81 MG tablet Take 81 mg by mouth daily.    . cetirizine (ZYRTEC) 10 MG tablet Take 10 mg by mouth daily.    . Cholecalciferol (VITAMIN D3) 5000 UNITS CAPS Take 5,000 Units by mouth daily.    . Multiple Vitamin (MULTIVITAMIN) tablet Take 1 tablet by mouth daily.  No current facility-administered medications on file prior to visit.    Health Maintenance:  Immunization History  Administered Date(s) Administered  . Influenza Split 01/04/2013, 02/01/2014  . PPD Test 08/08/2013  . Tdap 08/04/2011    Patient Care Team: Lucky Cowboy, MD as PCP - General (Internal Medicine) Elmon Else, MD as Consulting Physician (Dermatology) Davina Poke (Optometry) Sharrell Ku, MD as Consulting Physician (Gastroenterology) Judene Companion, MD as Attending Physician (Cardiology)  Allergies: No Known  Allergies  Medical History:  Past Medical History  Diagnosis Date  . Hyperlipidemia   . Unspecified vitamin D deficiency   . Labile hypertension   . Allergy     Surgical History:  Past Surgical History  Procedure Laterality Date  . Laparoscopic gastric banding  2009  . Hemorrhoid banding  09/2011    Family History:  Family History  Problem Relation Age of Onset  . Cancer Mother     Breast  . Hypertension Mother   . Hypertension Father   . Heart attack Father   . Arthritis Father     Social History:   History  Substance Use Topics  . Smoking status: Former Smoker -- 2.00 packs/day for 15 years    Quit date: 05/18/1993  . Smokeless tobacco: Not on file  . Alcohol Use: No    Review of Systems:  Review of Systems  Constitutional: Negative for fever, chills and malaise/fatigue.  HENT: Negative for congestion, ear pain and sore throat.   Eyes: Negative for blurred vision and double vision.  Respiratory: Negative for cough, shortness of breath and wheezing.   Cardiovascular: Negative for chest pain, palpitations and leg swelling.  Gastrointestinal: Negative for heartburn, nausea, vomiting, diarrhea, constipation, blood in stool and melena.  Genitourinary: Negative.   Skin: Negative.   Neurological: Negative for dizziness, sensory change, loss of consciousness and headaches.  Psychiatric/Behavioral: Negative for depression. The patient is not nervous/anxious and does not have insomnia.     Physical Exam: Estimated body mass index is 35.49 kg/(m^2) as calculated from the following:   Height as of this encounter: 5' 11.5" (1.816 m).   Weight as of this encounter: 258 lb (117.028 kg). BP 108/72 mmHg  Pulse 54  Temp(Src) 98.6 F (37 C) (Temporal)  Resp 18  Ht 5' 11.5" (1.816 m)  Wt 258 lb (117.028 kg)  BMI 35.49 kg/m2  General Appearance: Well nourished, in no apparent distress.  Eyes: PERRLA, EOMs, conjunctiva no swelling or erythema ENT/Mouth: Ear canals clear  bilaterally with no erythema, swelling, discharge.  TMs normal bilaterally with no erythema, bulging, or retractions.  Oropharynx clear and moist with no exudate, swelling, or erythema.  Dentition normal.   Neck: Supple, thyroid normal. No bruits, JVD, cervical adenopathy Respiratory: Respiratory effort normal, BS equal bilaterally without rales, rhonchi, wheezing or stridor.  Cardio: RRR without murmurs, rubs or gallops. Brisk peripheral pulses without edema.  Chest: symmetric, with normal excursions Abdomen: Obese, Soft, nontender, no guarding, rebound, hernias, masses, or organomegaly. Genitourinary: deferred per patient Musculoskeletal: Full ROM all peripheral extremities,5/5 strength, and normal gait. There is tenderness to palpation of the MCP joint.  Mildly positive finklesteins test.  Full active and passive ROM.  Neurovascularly intact. Skin: Warm, dry without rashes, lesions, ecchymosis. Neuro: A&Ox3, Cranial nerves intact, reflexes equal bilaterally. Normal muscle tone, no cerebellar symptoms. Sensation intact.  Psych: Normal affect, Insight and Judgment appropriate.   EKG: WNL no changes.  Over 40 minutes of exam, counseling, chart review and critical decision making was  performed  FORCUCCI, Elon Lomeli 10:37 AM [facility]

## 2014-09-18 NOTE — Patient Instructions (Signed)
Preventive Care for Adults  A healthy lifestyle and preventive care can promote health and wellness. Preventive health guidelines for men include the following key practices:  A routine yearly physical is a good way to check with your health care provider about your health and preventative screening. It is a chance to share any concerns and updates on your health and to receive a thorough exam.  Visit your dentist for a routine exam and preventative care every 6 months. Brush your teeth twice a day and floss once a day. Good oral hygiene prevents tooth decay and gum disease.  The frequency of eye exams is based on your age, health, family medical history, use of contact lenses, and other factors. Follow your health care provider's recommendations for frequency of eye exams.  Eat a healthy diet. Foods such as vegetables, fruits, whole grains, low-fat dairy products, and lean protein foods contain the nutrients you need without too many calories. Decrease your intake of foods high in solid fats, added sugars, and salt. Eat the right amount of calories for you.Get information about a proper diet from your health care provider, if necessary.  Regular physical exercise is one of the most important things you can do for your health. Most adults should get at least 150 minutes of moderate-intensity exercise (any activity that increases your heart rate and causes you to sweat) each week. In addition, most adults need muscle-strengthening exercises on 2 or more days a week.  Maintain a healthy weight. The body mass index (BMI) is a screening tool to identify possible weight problems. It provides an estimate of body fat based on height and weight. Your health care provider can find your BMI and can help you achieve or maintain a healthy weight.For adults 20 years and older:  A BMI below 18.5 is considered underweight.  A BMI of 18.5 to 24.9 is normal.  A BMI of 25 to 29.9 is considered overweight.  A  BMI of 30 and above is considered obese.  Maintain normal blood lipids and cholesterol levels by exercising and minimizing your intake of saturated fat. Eat a balanced diet with plenty of fruit and vegetables. Blood tests for lipids and cholesterol should begin at age 20 and be repeated every 5 years. If your lipid or cholesterol levels are high, you are over 50, or you are at high risk for heart disease, you may need your cholesterol levels checked more frequently.Ongoing high lipid and cholesterol levels should be treated with medicines if diet and exercise are not working.  If you smoke, find out from your health care provider how to quit. If you do not use tobacco, do not start.  Lung cancer screening is recommended for adults aged 55-80 years who are at high risk for developing lung cancer because of a history of smoking. A yearly low-dose CT scan of the lungs is recommended for people who have at least a 30-pack-year history of smoking and are a current smoker or have quit within the past 15 years. A pack year of smoking is smoking an average of 1 pack of cigarettes a day for 1 year (for example: 1 pack a day for 30 years or 2 packs a day for 15 years). Yearly screening should continue until the smoker has stopped smoking for at least 15 years. Yearly screening should be stopped for people who develop a health problem that would prevent them from having lung cancer treatment.  If you choose to drink alcohol, do not have more   than 2 drinks per day. One drink is considered to be 12 ounces (355 mL) of beer, 5 ounces (148 mL) of wine, or 1.5 ounces (44 mL) of liquor.  Avoid use of street drugs. Do not share needles with anyone. Ask for help if you need support or instructions about stopping the use of drugs.  High blood pressure causes heart disease and increases the risk of stroke. Your blood pressure should be checked at least every 1-2 years. Ongoing high blood pressure should be treated with  medicines, if weight loss and exercise are not effective.  If you are 45-79 years old, ask your health care provider if you should take aspirin to prevent heart disease.  Diabetes screening involves taking a blood sample to check your fasting blood sugar level. This should be done once every 3 years, after age 45, if you are within normal weight and without risk factors for diabetes. Testing should be considered at a younger age or be carried out more frequently if you are overweight and have at least 1 risk factor for diabetes.  Colorectal cancer can be detected and often prevented. Most routine colorectal cancer screening begins at the age of 50 and continues through age 75. However, your health care provider may recommend screening at an earlier age if you have risk factors for colon cancer. On a yearly basis, your health care provider may provide home test kits to check for hidden blood in the stool. Use of a small camera at the end of a tube to directly examine the colon (sigmoidoscopy or colonoscopy) can detect the earliest forms of colorectal cancer. Talk to your health care provider about this at age 50, when routine screening begins. Direct exam of the colon should be repeated every 5-10 years through age 75, unless early forms of precancerous polyps or small growths are found.   Talk with your health care provider about prostate cancer screening.  Testicular cancer screening isrecommended for adult males. Screening includes self-exam, a health care provider exam, and other screening tests. Consult with your health care provider about any symptoms you have or any concerns you have about testicular cancer.  Use sunscreen. Apply sunscreen liberally and repeatedly throughout the day. You should seek shade when your shadow is shorter than you. Protect yourself by wearing long sleeves, pants, a wide-brimmed hat, and sunglasses year round, whenever you are outdoors.  Once a month, do a whole-body  skin exam, using a mirror to look at the skin on your back. Tell your health care provider about new moles, moles that have irregular borders, moles that are larger than a pencil eraser, or moles that have changed in shape or color.  Stay current with required vaccines (immunizations).  Influenza vaccine. All adults should be immunized every year.  Tetanus, diphtheria, and acellular pertussis (Td, Tdap) vaccine. An adult who has not previously received Tdap or who does not know his vaccine status should receive 1 dose of Tdap. This initial dose should be followed by tetanus and diphtheria toxoids (Td) booster doses every 10 years. Adults with an unknown or incomplete history of completing a 3-dose immunization series with Td-containing vaccines should begin or complete a primary immunization series including a Tdap dose. Adults should receive a Td booster every 10 years.  Varicella vaccine. An adult without evidence of immunity to varicella should receive 2 doses or a second dose if he has previously received 1 dose.  Human papillomavirus (HPV) vaccine. Males aged 13-21 years who have not   received the vaccine previously should receive the 3-dose series. Males aged 22-26 years may be immunized. Immunization is recommended through the age of 26 years for any male who has sex with males and did not get any or all doses earlier. Immunization is recommended for any person with an immunocompromised condition through the age of 26 years if he did not get any or all doses earlier. During the 3-dose series, the second dose should be obtained 4-8 weeks after the first dose. The third dose should be obtained 24 weeks after the first dose and 16 weeks after the second dose.  Zoster vaccine. One dose is recommended for adults aged 60 years or older unless certain conditions are present.    PREVNAR  - Pneumococcal 13-valent conjugate (PCV13) vaccine. When indicated, a person who is uncertain of his immunization  history and has no record of immunization should receive the PCV13 vaccine. An adult aged 19 years or older who has certain medical conditions and has not been previously immunized should receive 1 dose of PCV13 vaccine. This PCV13 should be followed with a dose of pneumococcal polysaccharide (PPSV23) vaccine. The PPSV23 vaccine dose should be obtained at least 8 weeks after the dose of PCV13 vaccine. An adult aged 19 years or older who has certain medical conditions and previously received 1 or more doses of PPSV23 vaccine should receive 1 dose of PCV13. The PCV13 vaccine dose should be obtained 1 or more years after the last PPSV23 vaccine dose.    PNEUMOVAX - Pneumococcal polysaccharide (PPSV23) vaccine. When PCV13 is also indicated, PCV13 should be obtained first. All adults aged 65 years and older should be immunized. An adult younger than age 65 years who has certain medical conditions should be immunized. Any person who resides in a nursing home or long-term care facility should be immunized. An adult smoker should be immunized. People with an immunocompromised condition and certain other conditions should receive both PCV13 and PPSV23 vaccines. People with human immunodeficiency virus (HIV) infection should be immunized as soon as possible after diagnosis. Immunization during chemotherapy or radiation therapy should be avoided. Routine use of PPSV23 vaccine is not recommended for American Indians, [place] Natives, or people younger than 65 years unless there are medical conditions that require PPSV23 vaccine. When indicated, people who have unknown immunization and have no record of immunization should receive PPSV23 vaccine. One-time revaccination 5 years after the first dose of PPSV23 is recommended for people aged 19-64 years who have chronic kidney failure, nephrotic syndrome, asplenia, or immunocompromised conditions. People who received 1-2 doses of PPSV23 before age 65 years should receive another  dose of PPSV23 vaccine at age 65 years or later if at least 5 years have passed since the previous dose. Doses of PPSV23 are not needed for people immunized with PPSV23 at or after age 65 years.    Hepatitis A vaccine. Adults who wish to be protected from this disease, have certain high-risk conditions, work with hepatitis A-infected animals, work in hepatitis A research labs, or travel to or work in countries with a high rate of hepatitis A should be immunized. Adults who were previously unvaccinated and who anticipate close contact with an international adoptee during the first 60 days after arrival in the [place] from a country with a high rate of hepatitis A should be immunized.    Hepatitis B vaccine. Adults should be immunized if they wish to be protected from this disease, have certain high-risk conditions, may be exposed to   blood or other infectious body fluids, are household contacts or sex partners of hepatitis B positive people, are clients or workers in certain care facilities, or travel to or work in countries with a high rate of hepatitis B.   Preventive Service / Frequency   Ages 40 to 64  Blood pressure check.  Lipid and cholesterol check  Lung cancer screening. / Every year if you are aged 55-80 years and have a 30-pack-year history of smoking and currently smoke or have quit within the past 15 years. Yearly screening is stopped once you have quit smoking for at least 15 years or develop a health problem that would prevent you from having lung cancer treatment.  Fecal occult blood test (FOBT) of stool. / Every year beginning at age 50 and continuing until age 75. You may not have to do this test if you get a colonoscopy every 10 years.  Flexible sigmoidoscopy** or colonoscopy.** / Every 5 years for a flexible sigmoidoscopy or every 10 years for a colonoscopy beginning at age 50 and continuing until age 75. Screening for abdominal aortic aneurysm (AAA)  by ultrasound is  recommended for people who have history of high blood pressure or who are current or former smokers.   

## 2014-09-19 LAB — URINALYSIS, ROUTINE W REFLEX MICROSCOPIC
BILIRUBIN URINE: NEGATIVE
GLUCOSE, UA: NEGATIVE mg/dL
Hgb urine dipstick: NEGATIVE
Ketones, ur: NEGATIVE mg/dL
LEUKOCYTES UA: NEGATIVE
Nitrite: NEGATIVE
PH: 7.5 (ref 5.0–8.0)
Protein, ur: NEGATIVE mg/dL
Urobilinogen, UA: 0.2 mg/dL (ref 0.0–1.0)

## 2014-09-19 LAB — MICROALBUMIN / CREATININE URINE RATIO
Creatinine, Urine: 83 mg/dL
MICROALB/CREAT RATIO: 2.4 mg/g (ref 0.0–30.0)
Microalb, Ur: 0.2 mg/dL (ref <2.0)

## 2014-09-19 LAB — VITAMIN D 25 HYDROXY (VIT D DEFICIENCY, FRACTURES): VIT D 25 HYDROXY: 70 ng/mL (ref 30–100)

## 2014-09-19 LAB — PSA: PSA: 0.87 ng/mL (ref <=4.00)

## 2014-09-19 LAB — INSULIN, RANDOM: Insulin: 4.8 u[IU]/mL (ref 2.0–19.6)

## 2014-09-19 LAB — TESTOSTERONE: Testosterone: 283 ng/dL — ABNORMAL LOW (ref 300–890)

## 2014-09-20 ENCOUNTER — Encounter: Payer: Self-pay | Admitting: Internal Medicine

## 2014-12-21 NOTE — Addendum Note (Signed)
Addended by: Terri Piedra A on: 12/21/2014 11:41 AM   Modules accepted: Level of Service, SmartSet

## 2014-12-28 ENCOUNTER — Ambulatory Visit: Payer: Self-pay | Admitting: Internal Medicine

## 2015-01-02 ENCOUNTER — Other Ambulatory Visit: Payer: Self-pay | Admitting: Internal Medicine

## 2015-07-30 ENCOUNTER — Other Ambulatory Visit: Payer: Self-pay | Admitting: Internal Medicine

## 2015-08-29 ENCOUNTER — Encounter: Payer: Self-pay | Admitting: Internal Medicine

## 2015-09-24 ENCOUNTER — Encounter: Payer: Self-pay | Admitting: Internal Medicine

## 2018-11-04 ENCOUNTER — Other Ambulatory Visit: Payer: Self-pay

## 2018-11-04 DIAGNOSIS — Z20822 Contact with and (suspected) exposure to covid-19: Secondary | ICD-10-CM

## 2018-11-07 LAB — NOVEL CORONAVIRUS, NAA: SARS-CoV-2, NAA: NOT DETECTED

## 2019-06-08 NOTE — Progress Notes (Signed)
Chief Complaint  Patient presents with  . New Patient (Initial Visit)   History of Present Illness: 59 yo male with history of HLD, GERD, former tobacco abuse and HTN here today as a new patient to establish cardiology care. He has a strong family history of CAD (PGF, father and brother with CAD). He has no personal history of CAD. He feels well overall. No chest pain, LE edema, dizziness, near syncope or syncope. Mild dyspnea with heavy exertion. He feels well overall. Mostly concerned because his 85 yo brother had CABG in December 2020.   Primary Care Physician: [name], MD   Past Medical History:  Diagnosis Date  . Allergy   . Hyperlipidemia   . Labile hypertension   . Unspecified vitamin D deficiency     Past Surgical History:  Procedure Laterality Date  . HEMORRHOID BANDING  09/2011  . LAPAROSCOPIC GASTRIC BANDING  2009    Current Outpatient Medications  Medication Sig Dispense Refill  . aspirin 81 MG tablet Take 81 mg by mouth daily.    Marland Kitchen buPROPion (WELLBUTRIN XL) 150 MG 24 hr tablet Take 1 tablet by mouth daily.    . Cholecalciferol (VITAMIN D3) 5000 UNITS CAPS Take 5,000 Units by mouth daily.    . Glucosamine HCl-MSM 750-750 MG TABS Take by mouth daily.    Marland Kitchen lisinopril (ZESTRIL) 10 MG tablet Take 1 tablet by mouth daily.    . Multiple Vitamin (MULTIVITAMIN) tablet Take 1 tablet by mouth daily.    . pantoprazole (PROTONIX) 40 MG tablet Take 1 tablet by mouth daily.    . rosuvastatin (CRESTOR) 10 MG tablet Take 10 mg by mouth daily.     No current facility-administered medications for this visit.    No Known Allergies  Social History   Socioeconomic History  . Marital status: Married    Spouse name: Not on file  . Number of children: Not on file  . Years of education: Not on file  . Highest education level: Not on file  Occupational History  . Not on file  Tobacco Use  . Smoking status: Former Smoker    Packs/day: 2.00    Years: 15.00    Pack  years: 30.00    Quit date: 05/18/1993    Years since quitting: 26.0  . Smokeless tobacco: Never Used  Substance and Sexual Activity  . Alcohol use: No  . Drug use: No  . Sexual activity: Not on file  Other Topics Concern  . Not on file  Social History Narrative  . Not on file   Social Determinants of Health   Financial Resource Strain:   . Difficulty of Paying Living Expenses: Not on file  Food Insecurity:   . Worried About Programme researcher, broadcasting/film/video in the Last Year: Not on file  . Ran Out of Food in the Last Year: Not on file  Transportation Needs:   . Lack of Transportation (Medical): Not on file  . Lack of Transportation (Non-Medical): Not on file  Physical Activity:   . Days of Exercise per Week: Not on file  . Minutes of Exercise per Session: Not on file  Stress:   . Feeling of Stress : Not on file  Social Connections:   . Frequency of Communication with Friends and Family: Not on file  . Frequency of Social Gatherings with Friends and Family: Not on file  . Attends Religious Services: Not on file  . Active Member of Clubs or Organizations: Not on  file  . Attends Archivist Meetings: Not on file  . Marital Status: Not on file  Intimate Partner Violence:   . Fear of Current or Ex-Partner: Not on file  . Emotionally Abused: Not on file  . Physically Abused: Not on file  . Sexually Abused: Not on file    Family History  Problem Relation Age of Onset  . Cancer Mother        Breast  . Hypertension Mother   . Hypertension Father   . Heart attack Father   . Arthritis Father   . CAD Brother   . CAD Paternal Grandfather     Review of Systems:  As stated in the HPI and otherwise negative.   BP 116/74   Pulse 60   Ht 5\' 11"  (1.803 m)   Wt 264 lb (119.7 kg)   SpO2 98%   BMI 36.82 kg/m   Physical Examination: General: Well developed, well nourished, NAD  HEENT: OP clear, mucus membranes moist  SKIN: warm, dry. No rashes. Neuro: No focal deficits   Musculoskeletal: Muscle strength 5/5 all ext  Psychiatric: Mood and affect normal  Neck: No JVD, no carotid bruits, no thyromegaly, no lymphadenopathy.  Lungs:Clear bilaterally, no wheezes, rhonci, crackles Cardiovascular: Regular rate and rhythm. No murmurs, gallops or rubs. Abdomen:Soft. Bowel sounds present. Non-tender.  Extremities: No lower extremity edema. Pulses are 2 + in the bilateral DP/PT.  EKG:  EKG is ordered today. The ekg ordered today demonstrates NSR, rate 60 bpm  Recent Labs: No results found for requested labs within last 8760 hours.   Lipid Panel    Component Value Date/Time   CHOL 180 09/18/2014 1110   TRIG 63 09/18/2014 1110   HDL 53 09/18/2014 1110   CHOLHDL 3.4 09/18/2014 1110   VLDL 13 09/18/2014 1110   LDLCALC 114 (H) 09/18/2014 1110     Wt Readings from Last 3 Encounters:  06/09/19 264 lb (119.7 kg)  09/18/14 258 lb (117 kg)  04/02/14 252 lb (114.3 kg)     Assessment and Plan:   1. Cardiovascular screening/Dyspnea: He has no chest pain or dyspnea. Will arrange an echo and an exercise treadmill stress. He is on an ASA and a statin.   2. HTN: Followed in primary care. BP controlled.   3. Hyperlipidemia: Followed in primary care. He is on a statin.   Current medicines are reviewed at length with the patient today.  The patient does not have concerns regarding medicines.  The following changes have been made:  no change  Labs/ tests ordered today include:   Orders Placed This Encounter  Procedures  . EXERCISE TOLERANCE TEST (ETT)  . EKG 12-Lead  . ECHOCARDIOGRAM COMPLETE     Disposition:   FU with me in 12 months   Signed, Lauree Chandler, MD 06/09/2019 9:46 AM    [facility] [address], [place], [place]  [address] Phone: [phone]; Fax: [phone]

## 2019-06-09 ENCOUNTER — Other Ambulatory Visit: Payer: Self-pay

## 2019-06-09 ENCOUNTER — Ambulatory Visit (INDEPENDENT_AMBULATORY_CARE_PROVIDER_SITE_OTHER): Payer: BC Managed Care – PPO | Admitting: Cardiovascular Disease

## 2019-06-09 VITALS — BP 116/74 | HR 60 | Ht 71.0 in | Wt 264.0 lb

## 2019-06-09 DIAGNOSIS — I10 Essential (primary) hypertension: Secondary | ICD-10-CM

## 2019-06-09 DIAGNOSIS — R0602 Shortness of breath: Secondary | ICD-10-CM | POA: Diagnosis not present

## 2019-06-09 DIAGNOSIS — Z136 Encounter for screening for cardiovascular disorders: Secondary | ICD-10-CM

## 2019-06-09 NOTE — Patient Instructions (Signed)
Medication Instructions:  Your provider recommends that you continue on your current medications as directed. Please refer to the Current Medication list given to you today.   *If you need a refill on your cardiac medications before your next appointment, please call your pharmacy*  Testing/Procedures: Your provider has requested that you have an echocardiogram. Echocardiography is a painless test that uses sound waves to create images of your heart. It provides your doctor with information about the size and shape of your heart and how well your heart's chambers and valves are working. This procedure takes approximately one hour. There are no restrictions for this procedure.   Your provider has requested that you have an exercise tolerance test. For further information please visit [email or web address] Please also follow instruction sheet, as given.   Follow-Up: At [facility], you and your health needs are our priority.  As part of our continuing mission to provide you with exceptional heart care, we have created designated Provider Care Teams.  These Care Teams include your primary Cardiologist (physician) and Advanced Practice Providers (APPs -  Physician Assistants and Nurse Practitioners) who all work together to provide you with the care you need, when you need it. Your next appointment:   12 month(s) The format for your next appointment:   In Person Provider:   You may see Dr. Clifton James or one of the following Advanced Practice Providers on your designated Care Team:    Ronie Spies, PA-C  Jacolyn Reedy, PA-C

## 2019-07-07 ENCOUNTER — Other Ambulatory Visit (HOSPITAL_COMMUNITY)
Admission: RE | Admit: 2019-07-07 | Discharge: 2019-07-07 | Disposition: A | Discharge status: Home / Self Care | Payer: BC Managed Care – PPO | Source: Ambulatory Visit | Attending: Cardiovascular Disease | Admitting: Cardiovascular Disease

## 2019-07-07 DIAGNOSIS — Z01812 Encounter for preprocedural laboratory examination: Secondary | ICD-10-CM | POA: Insufficient documentation

## 2019-07-07 DIAGNOSIS — Z20822 Contact with and (suspected) exposure to covid-19: Secondary | ICD-10-CM | POA: Insufficient documentation

## 2019-07-07 LAB — SARS CORONAVIRUS 2 (TAT 6-24 HRS): SARS Coronavirus 2: NEGATIVE

## 2019-07-11 ENCOUNTER — Ambulatory Visit (HOSPITAL_COMMUNITY): Payer: BC Managed Care – PPO | Attending: Cardiology

## 2019-07-11 ENCOUNTER — Other Ambulatory Visit: Payer: Self-pay

## 2019-07-11 ENCOUNTER — Ambulatory Visit (INDEPENDENT_AMBULATORY_CARE_PROVIDER_SITE_OTHER): Payer: BC Managed Care – PPO

## 2019-07-11 DIAGNOSIS — R0602 Shortness of breath: Secondary | ICD-10-CM

## 2019-07-11 DIAGNOSIS — I10 Essential (primary) hypertension: Secondary | ICD-10-CM | POA: Diagnosis not present

## 2019-07-11 DIAGNOSIS — Z136 Encounter for screening for cardiovascular disorders: Secondary | ICD-10-CM

## 2019-07-11 LAB — EXERCISE TOLERANCE TEST
Estimated workload: 7 METS
Exercise duration (min): 6 min
Exercise duration (sec): 0 s
MPHR: 162 {beats}/min
Peak HR: 164 {beats}/min
Percent HR: 101 %
RPE: 17
Rest HR: 92 {beats}/min

## 2019-07-12 ENCOUNTER — Telehealth: Payer: Self-pay | Admitting: *Deleted

## 2019-07-12 DIAGNOSIS — I77819 Aortic ectasia, unspecified site: Secondary | ICD-10-CM

## 2019-07-12 DIAGNOSIS — Z01812 Encounter for preprocedural laboratory examination: Secondary | ICD-10-CM

## 2019-07-12 NOTE — Telephone Encounter (Signed)
-----   Message from [name], MD sent at 07/12/2019  7:44 AM EDT ----- His heart is strong and his valves are ok. His heart muscle is a little thickened in the middle but nothing to do about this and should not cause any problems for him. His aorta appears to be slightly enlarged. Recommend that we arrange a chest CTA to evaluate his aorta. Johnathan Sullivan

## 2019-07-12 NOTE — Telephone Encounter (Signed)
Spoke with patient and informed of results/review and recommendations from Dr. Clifton James. Aware that he will be contacted to schedule CtA aorta and BMET to be done prior.

## 2019-07-27 ENCOUNTER — Other Ambulatory Visit: Payer: Self-pay

## 2019-07-27 ENCOUNTER — Other Ambulatory Visit: Payer: BC Managed Care – PPO | Admitting: *Deleted

## 2019-07-27 DIAGNOSIS — I77819 Aortic ectasia, unspecified site: Secondary | ICD-10-CM

## 2019-07-27 DIAGNOSIS — Z01812 Encounter for preprocedural laboratory examination: Secondary | ICD-10-CM

## 2019-07-27 LAB — BASIC METABOLIC PANEL
BUN/Creatinine Ratio: 22 — ABNORMAL HIGH (ref 9–20)
BUN: 18 mg/dL (ref 6–24)
CO2: 23 mmol/L (ref 20–29)
Calcium: 9.3 mg/dL (ref 8.7–10.2)
Chloride: 104 mmol/L (ref 96–106)
Creatinine, Ser: 0.81 mg/dL (ref 0.76–1.27)
GFR calc Af Amer: 113 mL/min/{1.73_m2} (ref >59)
GFR calc non Af Amer: 98 mL/min/{1.73_m2} (ref >59)
Glucose: 107 mg/dL — ABNORMAL HIGH (ref 65–99)
Potassium: 4.5 mmol/L (ref 3.5–5.2)
Sodium: 140 mmol/L (ref 134–144)

## 2019-08-01 ENCOUNTER — Other Ambulatory Visit: Payer: Self-pay

## 2019-08-01 ENCOUNTER — Ambulatory Visit (INDEPENDENT_AMBULATORY_CARE_PROVIDER_SITE_OTHER)
Admission: RE | Admit: 2019-08-01 | Discharge: 2019-08-01 | Disposition: A | Discharge status: Home / Self Care | Payer: BC Managed Care – PPO | Source: Ambulatory Visit | Attending: Cardiovascular Disease | Admitting: Cardiovascular Disease

## 2019-08-01 DIAGNOSIS — I77819 Aortic ectasia, unspecified site: Secondary | ICD-10-CM | POA: Diagnosis not present

## 2019-08-01 MED ORDER — IOHEXOL 350 MG/ML SOLN
100.0000 mL | Freq: Once | INTRAVENOUS | Status: AC | PRN
  Administered 2019-08-01: 100 mL via INTRAVENOUS

## 2019-08-04 ENCOUNTER — Telehealth: Payer: Self-pay | Admitting: Cardiovascular Disease

## 2019-08-04 NOTE — Telephone Encounter (Signed)
Will let Dr. Clifton James know the chest ct pt had done on 08/01/19 should be available for him to review.

## 2019-08-04 NOTE — Telephone Encounter (Signed)
Patient calling for CT results. 

## 2019-08-07 NOTE — Telephone Encounter (Signed)
Hey, I just looked at it. Not sure why that didn't come over. Can we let him know that his aorta looks ok. It is not enlarged. He has several tiny pulmonary nodules so will need non-contrast CT chest in 12 months. Thanks, chris

## 2019-08-07 NOTE — Telephone Encounter (Signed)
Patient has been informed of CTA results and need for non contrast CT in 1 year.

## 2019-09-15 DIAGNOSIS — K219 Gastro-esophageal reflux disease without esophagitis: Secondary | ICD-10-CM | POA: Insufficient documentation

## 2020-04-02 ENCOUNTER — Telehealth: Payer: Self-pay | Admitting: Cardiovascular Disease

## 2020-04-02 DIAGNOSIS — R918 Other nonspecific abnormal finding of lung field: Secondary | ICD-10-CM

## 2020-04-02 NOTE — Telephone Encounter (Signed)
  Patient would like to know if he needs to do a CT before is annual appointment in April. I did not see any orders for one. Please advise.

## 2020-04-25 NOTE — Telephone Encounter (Signed)
Left message for patient to call back.  His last CT was 08/01/19.  His appointment w Dr. Clifton James is 07/15/20.  Order placed for non contrast chest CT for f/u of pulmonary nodules to be done prior to next visit in cardiology.

## 2020-05-08 ENCOUNTER — Ambulatory Visit (INDEPENDENT_AMBULATORY_CARE_PROVIDER_SITE_OTHER)
Admission: RE | Admit: 2020-05-08 | Discharge: 2020-05-08 | Disposition: A | Discharge status: Home / Self Care | Payer: BC Managed Care – PPO | Source: Ambulatory Visit | Attending: Cardiovascular Disease | Admitting: Cardiovascular Disease

## 2020-05-08 ENCOUNTER — Other Ambulatory Visit: Payer: Self-pay

## 2020-05-08 DIAGNOSIS — R918 Other nonspecific abnormal finding of lung field: Secondary | ICD-10-CM | POA: Diagnosis not present

## 2020-07-15 ENCOUNTER — Other Ambulatory Visit: Payer: Self-pay

## 2020-07-15 ENCOUNTER — Telehealth: Payer: Self-pay | Admitting: Cardiovascular Disease

## 2020-07-15 ENCOUNTER — Ambulatory Visit (INDEPENDENT_AMBULATORY_CARE_PROVIDER_SITE_OTHER): Payer: BC Managed Care – PPO | Admitting: Cardiovascular Disease

## 2020-07-15 ENCOUNTER — Encounter: Payer: Self-pay | Admitting: Cardiovascular Disease

## 2020-07-15 VITALS — BP 110/72 | HR 67 | Ht 71.0 in | Wt 269.0 lb

## 2020-07-15 DIAGNOSIS — I422 Other hypertrophic cardiomyopathy: Secondary | ICD-10-CM | POA: Diagnosis not present

## 2020-07-15 DIAGNOSIS — I77819 Aortic ectasia, unspecified site: Secondary | ICD-10-CM | POA: Diagnosis not present

## 2020-07-15 DIAGNOSIS — I10 Essential (primary) hypertension: Secondary | ICD-10-CM | POA: Diagnosis not present

## 2020-07-15 LAB — BASIC METABOLIC PANEL
BUN/Creatinine Ratio: 26 — ABNORMAL HIGH (ref 9–20)
BUN: 23 mg/dL (ref 6–24)
CO2: 22 mmol/L (ref 20–29)
Calcium: 9.3 mg/dL (ref 8.7–10.2)
Chloride: 106 mmol/L (ref 96–106)
Creatinine, Ser: 0.9 mg/dL (ref 0.76–1.27)
Glucose: 104 mg/dL — ABNORMAL HIGH (ref 65–99)
Potassium: 4.6 mmol/L (ref 3.5–5.2)
Sodium: 141 mmol/L (ref 134–144)
eGFR: 98 mL/min/{1.73_m2} (ref >59)

## 2020-07-15 NOTE — Progress Notes (Signed)
Chief Complaint  Patient presents with  . Follow-up    Septal hypertrophy   History of Present Illness: 60 yo male with history of HLD, GERD, former tobacco abuse and HTN here today for cardiac follow up. I saw him as a new patient in February 2021 to establish cardiology care. He has a strong family history of CAD (PGF, father and brother with CAD). He has no personal history of CAD. He felt well overall with no chest pain, LE edema, dizziness, near syncope or syncope. Mild dyspnea with heavy exertion. Echo March 2021 with LVEF=60-65%. 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 No valve disease. Exercise stress test without ischemia. Mild dilation of the ascending aorta by CTA April 2021 at 3.7 cm. Lung nodules on CT in April 2021 that are stable by non contrast CT in January 2022. He has no family history of sudden cardiac death.   He is here today for follow up. The patient denies any chest pain, dyspnea, palpitations, lower extremity edema, orthopnea, PND, dizziness, near syncope or syncope.   Primary Care Physician: [name], MD  Past Medical History:  Diagnosis Date  . Allergy   . Hyperlipidemia   . Labile hypertension   . Unspecified vitamin D deficiency     Past Surgical History:  Procedure Laterality Date  . HEMORRHOID BANDING  09/2011  . LAPAROSCOPIC GASTRIC BANDING  2009    Current Outpatient Medications  Medication Sig Dispense Refill  . aspirin 81 MG tablet Take 81 mg by mouth daily.    Marland Kitchen buPROPion (WELLBUTRIN XL) 150 MG 24 hr tablet Take 1 tablet by mouth daily.    . Cholecalciferol (VITAMIN D3) 5000 UNITS CAPS Take 5,000 Units by mouth daily.    . Glucosamine HCl-MSM 750-750 MG TABS Take by mouth daily.    Marland Kitchen lisinopril (ZESTRIL) 10 MG tablet Take 1 tablet by mouth daily.    . Multiple Vitamin (MULTIVITAMIN) tablet Take 1 tablet by mouth daily.    . pantoprazole (PROTONIX) 40 MG tablet Take 1 tablet by mouth  daily.    . rosuvastatin (CRESTOR) 10 MG tablet Take 10 mg by mouth daily.     No current facility-administered medications for this visit.    No Known Allergies  Social History   Socioeconomic History  . Marital status: Married    Spouse name: Not on file  . Number of children: Not on file  . Years of education: Not on file  . Highest education level: Not on file  Occupational History  . Not on file  Tobacco Use  . Smoking status: Former Smoker    Packs/day: 2.00    Years: 15.00    Pack years: 30.00    Quit date: 05/18/1993    Years since quitting: 27.1  . Smokeless tobacco: Never Used  Substance and Sexual Activity  . Alcohol use: No  . Drug use: No  . Sexual activity: Not on file  Other Topics Concern  . Not on file  Social History Narrative  . Not on file   Social Determinants of Health   Financial Resource Strain: Not on file  Food Insecurity: Not on file  Transportation Needs: Not on file  Physical Activity: Not on file  Stress: Not on file  Social Connections: Not on file  Intimate Partner Violence: Not on file    Family History  Problem Relation Age of Onset  . Cancer Mother  Breast  . Hypertension Mother   . Hypertension Father   . Heart attack Father   . Arthritis Father   . CAD Brother   . CAD Paternal Grandfather     Review of Systems:  As stated in the HPI and otherwise negative.   BP 110/72   Pulse 67   Ht 5\' 11"  (1.803 m)   Wt 269 lb (122 kg)   SpO2 97%   BMI 37.52 kg/m   Physical Examination:. General: Well developed, well nourished, NAD  HEENT: OP clear, mucus membranes moist  SKIN: warm, dry. No rashes. Neuro: No focal deficits  Musculoskeletal: Muscle strength 5/5 all ext  Psychiatric: Mood and affect normal  Neck: No JVD, no carotid bruits, no thyromegaly, no lymphadenopathy.  Lungs:Clear bilaterally, no wheezes, rhonci, crackles Cardiovascular: Regular rate and rhythm. No murmurs, gallops or rubs. Abdomen:Soft.  Bowel sounds present. Non-tender.  Extremities: No lower extremity edema. Pulses are 2 + in the bilateral DP/PT.  EKG:  EKG is ordered today. The ekg ordered today demonstrates sinus  Echo 07/11/19: 1. Left ventricular ejection fraction, by estimation, is 60 to 65%. The  left ventricle has normal function. The left ventricle has no regional  wall motion abnormalities. There is severe asymmetric left ventricular  hypertrophy of the basal-septal  segment. No LVOT obstruction. Left ventricular diastolic parameters were  normal.  2. Right ventricular systolic function is normal. The right ventricular  size is moderately enlarged. There is normal pulmonary artery systolic  pressure.  3. The mitral valve is normal in structure. No evidence of mitral valve  regurgitation.  4. The aortic valve is tricuspid. Aortic valve regurgitation is not  visualized. No aortic stenosis is present.  5. Aortic dilatation noted. There is dilatation of the ascending aorta  measuring 41 mm.   Recent Labs: 07/27/2019: BUN 18; Creatinine, Ser 0.81; Potassium 4.5; Sodium 140   Lipid Panel    Component Value Date/Time   CHOL 180 09/18/2014 1110   TRIG 63 09/18/2014 1110   HDL 53 09/18/2014 1110   CHOLHDL 3.4 09/18/2014 1110   VLDL 13 09/18/2014 1110   LDLCALC 114 (H) 09/18/2014 1110     Wt Readings from Last 3 Encounters:  07/15/20 269 lb (122 kg)  06/09/19 264 lb (119.7 kg)  09/18/14 258 lb (117 kg)     Assessment and Plan:   1. Cardiovascular screening: Normal LV systolic function and normal exercise stress test in 2021. He is on an ASA and a statin.   2. HTN: Followed in primary care. BP is well controlled.   3. Hyperlipidemia: Followed in primary care. He is on a statin.   4. 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 No LVOT obstruction. No dizziness or near syncope. He has no family history of sudden cardiac death. I have reviewed his echo this morning with one  of our imaging specialists. Will arrange a cardiac MRI to better assess his septal hypertrophy.   5. Dilated ascending aorta: Will assess with MRI  Current medicines are reviewed at length with the patient today.  The patient does not have concerns regarding medicines.  The following changes have been made:  no change  Labs/ tests ordered today include:   Orders Placed This Encounter  Procedures  . MR Card Morphology Wo/W Cm  . Basic metabolic panel  . EKG 12-Lead     Disposition:   F/U with me in 12 months   Signed, 2022, MD  07/15/2020 10:26 AM    [facility] [address] [place], [place], [place]  [address] Phone: [phone]; Fax: [phone]

## 2020-07-15 NOTE — Telephone Encounter (Signed)
Spoke with patient to discuss preferred weekdays ant times for scheduling the Cardiac MRI ordered by Dr. Clifton James.  Informed patient as soon as we hear regarding the insurance prior authorization--I will be in touch with the appointment information.  Patient voiced his understanding.

## 2020-07-15 NOTE — Patient Instructions (Signed)
Medication Instructions:  No changes *If you need a refill on your cardiac medications before your next appointment, please call your pharmacy*   Lab Work: Today: BMET  If you have labs (blood work) drawn today and your tests are completely normal, you will receive your results only by: Marland Kitchen MyChart Message (if you have MyChart) OR . A paper copy in the mail If you have any lab test that is abnormal or we need to change your treatment, we will call you to review the results.   Testing/Procedures: Your physician has requested that you have a cardiac MRI. Cardiac MRI uses a computer to create images of your heart as its beating, producing both still and moving pictures of your heart and major blood vessels. For further information please visit [email or web address]. Please follow the instruction sheet given to you today for more information.    Follow-Up: At [facility], you and your health needs are our priority.  As part of our continuing mission to provide you with exceptional heart care, we have created designated Provider Care Teams.  These Care Teams include your primary Cardiologist (physician) and Advanced Practice Providers (APPs -  Physician Assistants and Nurse Practitioners) who all work together to provide you with the care you need, when you need it.   Your next appointment:   12 month(s)  The format for your next appointment:   In Person  Provider:   You may see Verne Carrow, MD or one of the following Advanced Practice Providers on your designated Care Team:    Ronie Spies, PA-C  Jacolyn Reedy, PA-C    Other Instructions

## 2020-07-16 ENCOUNTER — Encounter: Payer: Self-pay | Admitting: Cardiovascular Disease

## 2020-07-16 NOTE — Telephone Encounter (Signed)
Spoke with patient regarrdng the Wednesday 08/28/20 8:00 am Cardiac MRI appointment at [facility]---arrival time is 7:30 am--1st floor admissions office for check in---will mail information to patient and he states he has viewed the information in My Chart.

## 2020-08-26 ENCOUNTER — Telehealth (HOSPITAL_COMMUNITY): Payer: Self-pay | Admitting: Emergency Medicine

## 2020-08-26 DIAGNOSIS — I77819 Aortic ectasia, unspecified site: Secondary | ICD-10-CM

## 2020-08-26 NOTE — Telephone Encounter (Signed)
Attempted to call patient regarding upcoming cardiac MR appointment. Left message on voicemail with name and callback number Lus Kriegel RN Navigator Cardiac Imaging [facility] [phone] Office [phone] Cell  

## 2020-08-28 ENCOUNTER — Other Ambulatory Visit: Payer: Self-pay

## 2020-08-28 ENCOUNTER — Ambulatory Visit (HOSPITAL_COMMUNITY)
Admission: RE | Admit: 2020-08-28 | Discharge: 2020-08-28 | Disposition: A | Discharge status: Home / Self Care | Payer: BC Managed Care – PPO | Source: Ambulatory Visit | Attending: Cardiovascular Disease | Admitting: Cardiovascular Disease

## 2020-08-28 DIAGNOSIS — I10 Essential (primary) hypertension: Secondary | ICD-10-CM | POA: Insufficient documentation

## 2020-08-28 DIAGNOSIS — I77819 Aortic ectasia, unspecified site: Secondary | ICD-10-CM | POA: Insufficient documentation

## 2020-08-28 DIAGNOSIS — I422 Other hypertrophic cardiomyopathy: Secondary | ICD-10-CM | POA: Insufficient documentation

## 2020-08-28 MED ORDER — GADOBUTROL 1 MMOL/ML IV SOLN
10.0000 mL | Freq: Once | INTRAVENOUS | Status: AC | PRN
  Administered 2020-08-28: 10 mL via INTRAVENOUS

## 2020-09-24 ENCOUNTER — Telehealth: Payer: Self-pay | Admitting: Cardiovascular Disease

## 2020-09-24 NOTE — Telephone Encounter (Signed)
Patient notified of results of cardiac MRI.    Johnathan Hazel, MD  Lendon Ka, RN The middle part of his heart muscle, the septum, is thickened by no high riskfeatures. His heart is strong. No changes at this time. [name]

## 2020-09-24 NOTE — Telephone Encounter (Signed)
Patient was returning the call for results

## 2021-01-15 DIAGNOSIS — Z20822 Contact with and (suspected) exposure to covid-19: Secondary | ICD-10-CM | POA: Diagnosis not present

## 2021-02-27 DIAGNOSIS — E7800 Pure hypercholesterolemia, unspecified: Secondary | ICD-10-CM | POA: Diagnosis not present

## 2021-02-27 DIAGNOSIS — F5101 Primary insomnia: Secondary | ICD-10-CM | POA: Diagnosis not present

## 2021-02-27 DIAGNOSIS — K219 Gastro-esophageal reflux disease without esophagitis: Secondary | ICD-10-CM | POA: Diagnosis not present

## 2021-02-27 DIAGNOSIS — Z0000 Encounter for general adult medical examination without abnormal findings: Secondary | ICD-10-CM | POA: Diagnosis not present

## 2021-02-27 DIAGNOSIS — E559 Vitamin D deficiency, unspecified: Secondary | ICD-10-CM | POA: Diagnosis not present

## 2021-02-27 DIAGNOSIS — Z23 Encounter for immunization: Secondary | ICD-10-CM | POA: Diagnosis not present

## 2021-02-27 DIAGNOSIS — Z130 Encounter for screening for diseases of the blood and blood-forming organs and certain disorders involving the immune mechanism: Secondary | ICD-10-CM | POA: Diagnosis not present

## 2021-02-27 DIAGNOSIS — R0989 Other specified symptoms and signs involving the circulatory and respiratory systems: Secondary | ICD-10-CM | POA: Diagnosis not present

## 2021-05-20 DIAGNOSIS — Z23 Encounter for immunization: Secondary | ICD-10-CM | POA: Diagnosis not present

## 2021-06-06 NOTE — Addendum Note (Signed)
Encounter addended by: Novella Olive on: 06/06/2021 12:46 PM  Actions taken: Letter saved

## 2021-07-17 NOTE — Progress Notes (Signed)
? ?Chief Complaint  ?Patient presents with  ? Follow-up  ?  Septal hypertrophy  ? ?History of Present Illness: 61 yo male with history of HLD, GERD, former tobacco abuse and HTN here today for cardiac follow up. I saw him as a new patient in February 2021 to establish cardiology care. He has a strong family history of CAD (PGF, father and brother with CAD). He has no personal history of CAD. He felt well overall with no chest pain, LE edema, dizziness, near syncope or syncope. Mild dyspnea with heavy exertion. Echo March 2021 with LVEF=60-65%. 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 No valve disease. Exercise stress test without ischemia. Mild dilation of the ascending aorta by CTA April 2021 at 3.7 cm. Lung nodules on CT in April 2021 that are stable by non contrast CT in January 2022. He has no family history of sudden cardiac death. Cardiac MRI may 2022 with normal LV size with focal severe basal septal hypertrophy. No SAM. LVEF around 60%. Aortic root 3.7 cm.  ? ?He is here today for follow up. The patient denies any chest pain, dyspnea, palpitations, lower extremity edema, orthopnea, PND, dizziness, near syncope or syncope.  ? ?Primary Care Physician: Eartha InchBadger, Michael C, MD ? ?Past Medical History:  ?Diagnosis Date  ? Allergy   ? Hyperlipidemia   ? Labile hypertension   ? Unspecified vitamin D deficiency   ? ? ?Past Surgical History:  ?Procedure Laterality Date  ? HEMORRHOID BANDING  09/2011  ? LAPAROSCOPIC GASTRIC BANDING  2009  ? ? ?Current Outpatient Medications  ?Medication Sig Dispense Refill  ? aspirin 81 MG tablet Take 81 mg by mouth daily.    ? buPROPion (WELLBUTRIN XL) 150 MG 24 hr tablet Take 1 tablet by mouth daily.    ? Cholecalciferol (VITAMIN D3) 5000 UNITS CAPS Take 5,000 Units by mouth daily.    ? Glucosamine HCl-MSM 750-750 MG TABS Take by mouth daily.    ? lisinopril (ZESTRIL) 10 MG tablet Take 1 tablet by mouth daily.    ? Multiple Vitamin  (MULTIVITAMIN) tablet Take 1 tablet by mouth daily.    ? pantoprazole (PROTONIX) 40 MG tablet Take 1 tablet by mouth daily.    ? rosuvastatin (CRESTOR) 10 MG tablet Take 10 mg by mouth daily.    ? ?No current facility-administered medications for this visit.  ? ? ?No Known Allergies ? ?Social History  ? ?Socioeconomic History  ? Marital status: Married  ?  Spouse name: Not on file  ? Number of children: Not on file  ? Years of education: Not on file  ? Highest education level: Not on file  ?Occupational History  ? Not on file  ?Tobacco Use  ? Smoking status: Former  ?  Packs/day: 2.00  ?  Years: 15.00  ?  Pack years: 30.00  ?  Types: Cigarettes  ?  Quit date: 05/18/1993  ?  Years since quitting: 28.1  ? Smokeless tobacco: Never  ?Substance and Sexual Activity  ? Alcohol use: No  ? Drug use: No  ? Sexual activity: Not on file  ?Other Topics Concern  ? Not on file  ?Social History Narrative  ? Not on file  ? ?Social Determinants of Health  ? ?Financial Resource Strain: Not on file  ?Food Insecurity: Not on file  ?Transportation Needs: Not on file  ?Physical Activity: Not on file  ?Stress: Not on file  ?Social Connections: Not on file  ?Intimate  Partner Violence: Not on file  ? ? ?Family History  ?Problem Relation Age of Onset  ? Cancer Mother   ?     Breast  ? Hypertension Mother   ? Hypertension Father   ? Heart attack Father   ? Arthritis Father   ? CAD Brother   ? CAD Paternal Grandfather   ? ? ?Review of Systems:  As stated in the HPI and otherwise negative.  ? ?BP 110/70   Pulse 63   Ht 5\' 11"  (1.803 m)   Wt 282 lb 6.4 oz (128.1 kg)   SpO2 99%   BMI 39.39 kg/m?  ? ?Physical Examination:. ?General: Well developed, well nourished, NAD  ?HEENT: OP clear, mucus membranes moist  ?SKIN: warm, dry. No rashes. ?Neuro: No focal deficits  ?Musculoskeletal: Muscle strength 5/5 all ext  ?Psychiatric: Mood and affect normal  ?Neck: No JVD, no carotid bruits, no thyromegaly, no lymphadenopathy.  ?Lungs:Clear bilaterally, no  wheezes, rhonci, crackles ?Cardiovascular: Regular rate and rhythm. No murmurs, gallops or rubs. ?Abdomen:Soft. Bowel sounds present. Non-tender.  ?Extremities: No lower extremity edema. Pulses are 2 + in the bilateral DP/PT. ? ?EKG:  EKG is ordered today. ?The ekg ordered today demonstrates sinus ? ?Echo 07/11/19: ? 1. Left ventricular ejection fraction, by estimation, is 60 to 65%. The  ?left ventricle has normal function. The left ventricle has no regional  ?wall motion abnormalities. There is severe asymmetric left ventricular  ?hypertrophy of the basal-septal  ?segment. No LVOT obstruction. Left ventricular diastolic parameters were  ?normal.  ? 2. Right ventricular systolic function is normal. The right ventricular  ?size is moderately enlarged. There is normal pulmonary artery systolic  ?pressure.  ? 3. The mitral valve is normal in structure. No evidence of mitral valve  ?regurgitation.  ? 4. The aortic valve is tricuspid. Aortic valve regurgitation is not  ?visualized. No aortic stenosis is present.  ? 5. Aortic dilatation noted. There is dilatation of the ascending aorta  ?measuring 41 mm.  ? ?Recent Labs: ?No results found for requested labs within last 8760 hours.  ? ?Lipid Panel ?   ?Component Value Date/Time  ? CHOL 180 09/18/2014 1110  ? TRIG 63 09/18/2014 1110  ? HDL 53 09/18/2014 1110  ? CHOLHDL 3.4 09/18/2014 1110  ? VLDL 13 09/18/2014 1110  ? LDLCALC 114 (H) 09/18/2014 1110  ? ?  ?Wt Readings from Last 3 Encounters:  ?07/18/21 282 lb 6.4 oz (128.1 kg)  ?07/15/20 269 lb (122 kg)  ?06/09/19 264 lb (119.7 kg)  ?  ? ?Assessment and Plan:  ? ?1. Cardiovascular screening: Normal LV systolic function and normal exercise stress test in 2021. He is on an ASA and a statin.  ? ?2. HTN: Followed in primary care. BP is controlled today ? ?3. Hyperlipidemia: Followed in primary care. He is on a statin.  ? ?4. 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 No LVOT obstruction. Cardiac MRI  in May 2022 with severe focal basal septal hypertrophy with no SAM. He has no family history of sudden cardiac death. No changes today ? ?5. 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 No need to repeat a scan at this time ? ?Current medicines are reviewed at length with the patient today.  The patient does not have concerns regarding medicines. ? ?The following changes have been made:  no change ? ?Labs/ tests ordered today include:  ? ?Orders Placed This Encounter  ?Procedures  ?  EKG 12-Lead  ? ?Disposition:   F/U with me in 12 months ? ? ?Signed, ?Verne Carrow, MD ?07/18/2021 9:00 AM    ?[facility] ?[address] [place], [place], [place]  [address] ?Phone: [phone]; Fax: [phone]  ? ?

## 2021-07-18 ENCOUNTER — Encounter: Payer: Self-pay | Admitting: Cardiovascular Disease

## 2021-07-18 ENCOUNTER — Ambulatory Visit (INDEPENDENT_AMBULATORY_CARE_PROVIDER_SITE_OTHER): Payer: BC Managed Care – PPO | Admitting: Cardiovascular Disease

## 2021-07-18 VITALS — BP 110/70 | HR 63 | Ht 71.0 in | Wt 282.4 lb

## 2021-07-18 DIAGNOSIS — I422 Other hypertrophic cardiomyopathy: Secondary | ICD-10-CM

## 2021-07-18 DIAGNOSIS — I10 Essential (primary) hypertension: Secondary | ICD-10-CM

## 2021-07-18 NOTE — Patient Instructions (Signed)
Medication Instructions:  ?Your physician recommends that you continue on your current medications as directed. Please refer to the Current Medication list given to you today. ? ?*If you need a refill on your cardiac medications before your next appointment, please call your pharmacy* ? ? ?Lab Work: ?None ?If you have labs (blood work) drawn today and your tests are completely normal, you will receive your results only by: ?MyChart Message (if you have MyChart) OR ?A paper copy in the mail ?If you have any lab test that is abnormal or we need to change your treatment, we will call you to review the results. ? ? ?Testing/Procedures: ?None ? ? ?Follow-Up: ?At [facility], you and your health needs are our priority.  As part of our continuing mission to provide you with exceptional heart care, we have created designated Provider Care Teams.  These Care Teams include your primary Cardiologist (physician) and Advanced Practice Providers (APPs -  Physician Assistants and Nurse Practitioners) who all work together to provide you with the care you need, when you need it. ? ?We recommend signing up for the patient portal called "MyChart".  Sign up information is provided on this After Visit Summary.  MyChart is used to connect with patients for Virtual Visits (Telemedicine).  Patients are able to view lab/test results, encounter notes, upcoming appointments, etc.  Non-urgent messages can be sent to your provider as well.   ?To learn more about what you can do with MyChart, go to [email or web address].   ? ?Your next appointment:   ?1 year(s) ? ?The format for your next appointment:   ?In Person ? ?Provider:   ?Christopher McAlhany, MD  ? ? ?Other Instructions ?  ?

## 2021-08-10 DIAGNOSIS — J189 Pneumonia, unspecified organism: Secondary | ICD-10-CM | POA: Diagnosis not present

## 2021-08-10 DIAGNOSIS — R059 Cough, unspecified: Secondary | ICD-10-CM | POA: Diagnosis not present

## 2021-08-10 DIAGNOSIS — Z20822 Contact with and (suspected) exposure to covid-19: Secondary | ICD-10-CM | POA: Diagnosis not present

## 2021-08-13 DIAGNOSIS — R062 Wheezing: Secondary | ICD-10-CM | POA: Diagnosis not present

## 2021-08-13 DIAGNOSIS — J189 Pneumonia, unspecified organism: Secondary | ICD-10-CM | POA: Diagnosis not present

## 2021-10-24 ENCOUNTER — Ambulatory Visit: Payer: BC Managed Care – PPO | Admitting: Cardiovascular Disease

## 2022-07-07 DIAGNOSIS — Z1330 Encounter for screening examination for mental health and behavioral disorders, unspecified: Secondary | ICD-10-CM | POA: Diagnosis not present

## 2022-07-07 DIAGNOSIS — B354 Tinea corporis: Secondary | ICD-10-CM | POA: Diagnosis not present

## 2022-07-07 DIAGNOSIS — R21 Rash and other nonspecific skin eruption: Secondary | ICD-10-CM | POA: Diagnosis not present

## 2022-08-05 DIAGNOSIS — M1711 Unilateral primary osteoarthritis, right knee: Secondary | ICD-10-CM | POA: Diagnosis not present

## 2022-08-17 DIAGNOSIS — M1711 Unilateral primary osteoarthritis, right knee: Secondary | ICD-10-CM | POA: Diagnosis not present

## 2022-08-24 DIAGNOSIS — M1711 Unilateral primary osteoarthritis, right knee: Secondary | ICD-10-CM | POA: Diagnosis not present

## 2022-08-31 DIAGNOSIS — M1711 Unilateral primary osteoarthritis, right knee: Secondary | ICD-10-CM | POA: Diagnosis not present

## 2022-09-10 DIAGNOSIS — J40 Bronchitis, not specified as acute or chronic: Secondary | ICD-10-CM | POA: Diagnosis not present

## 2022-09-24 DIAGNOSIS — I422 Other hypertrophic cardiomyopathy: Secondary | ICD-10-CM | POA: Diagnosis not present

## 2022-09-24 DIAGNOSIS — Z0000 Encounter for general adult medical examination without abnormal findings: Secondary | ICD-10-CM | POA: Diagnosis not present

## 2022-09-24 DIAGNOSIS — E559 Vitamin D deficiency, unspecified: Secondary | ICD-10-CM | POA: Diagnosis not present

## 2022-09-24 DIAGNOSIS — J189 Pneumonia, unspecified organism: Secondary | ICD-10-CM | POA: Diagnosis not present

## 2022-09-24 DIAGNOSIS — K219 Gastro-esophageal reflux disease without esophagitis: Secondary | ICD-10-CM | POA: Diagnosis not present

## 2022-09-24 DIAGNOSIS — E7800 Pure hypercholesterolemia, unspecified: Secondary | ICD-10-CM | POA: Diagnosis not present

## 2022-10-05 IMAGING — CT CT CHEST W/O CM
2 of 4 series · 15 of 36 positions shown, 18 images · non-contrast
Comparison: CT angio chest from 08/01/2018

CLINICAL DATA: Evaluate lung nodule.  Former smoker.

EXAM:
CT CHEST WITHOUT CONTRAST
TECHNIQUE: Multidetector CT imaging of the chest was performed following the
standard protocol without IV contrast.

[Series 2: thorax · axial · 0.82mm/px · z∈[-367,-101]mm · 12 of 159 slices shown, 15 images]
[im 13/159  mediastinal]
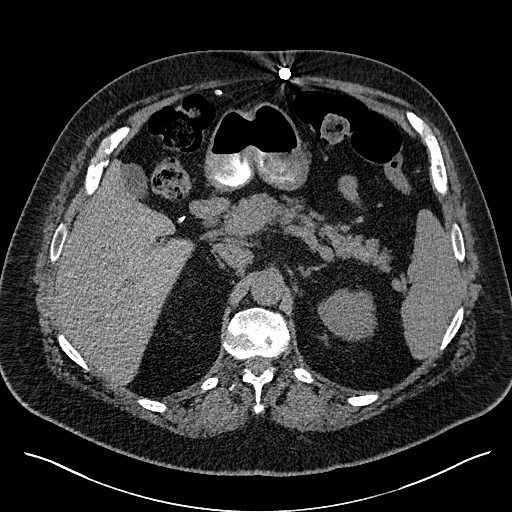
[im 13/159  lung]
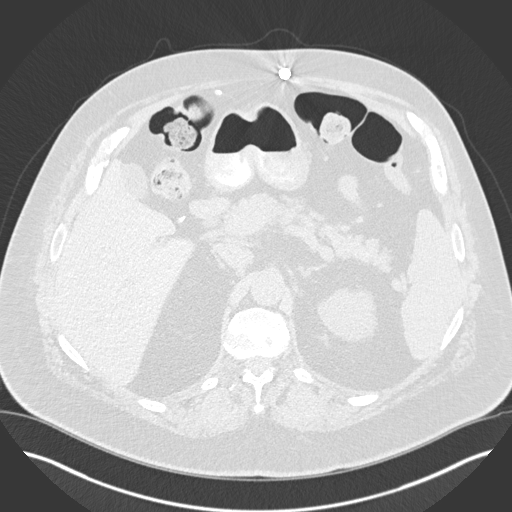
[im 25/159  lung]
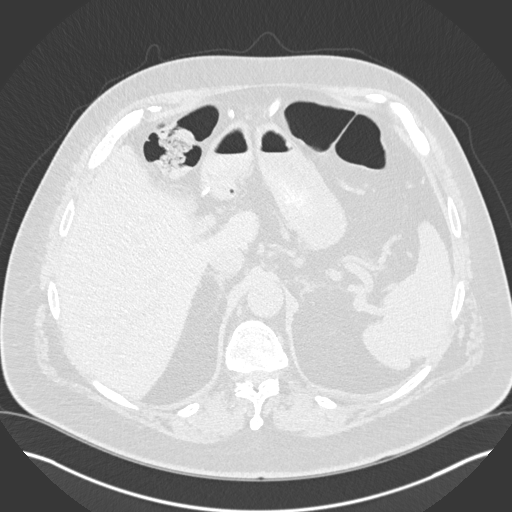
[im 37/159  lung]
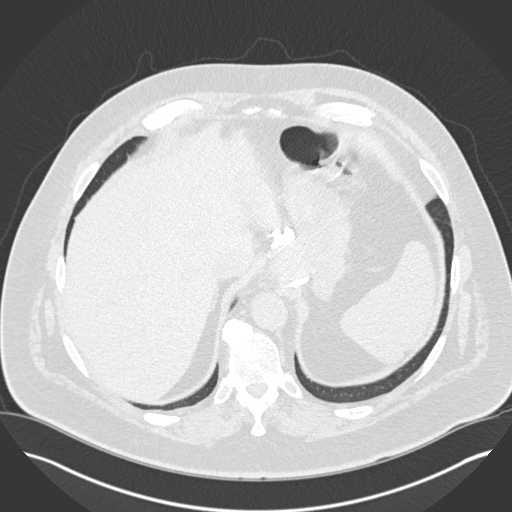
[im 49/159  lung]
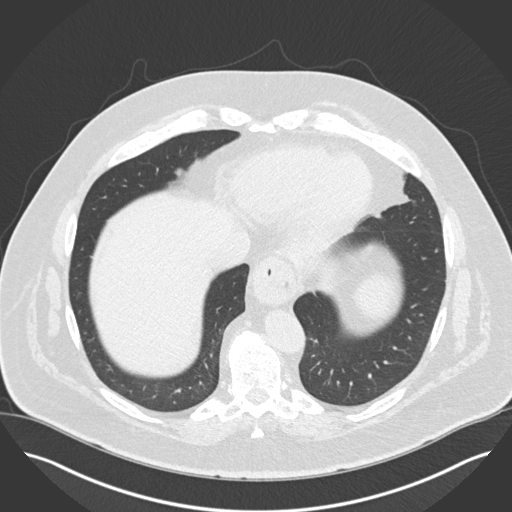
[im 61/159  mediastinal]
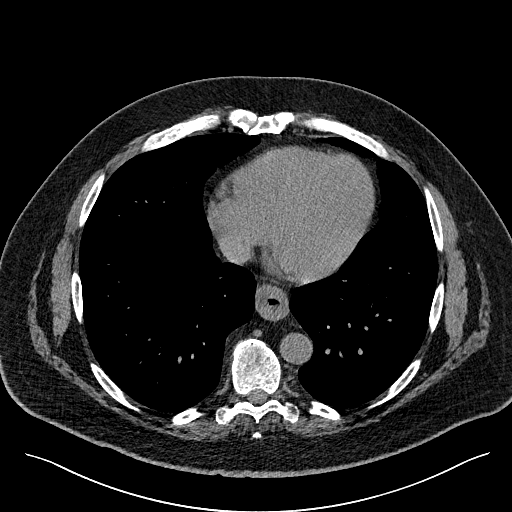
[im 61/159  lung]
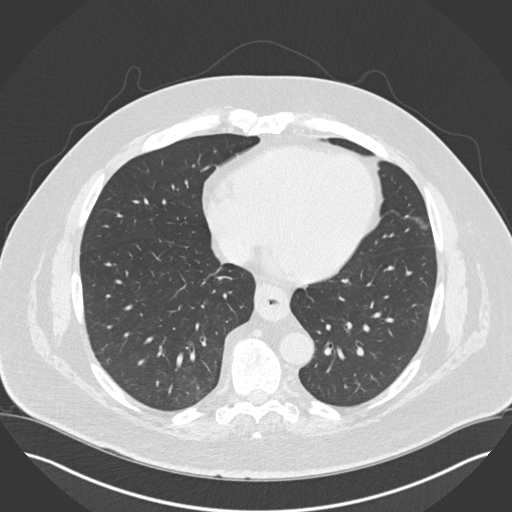
[im 73/159  lung]
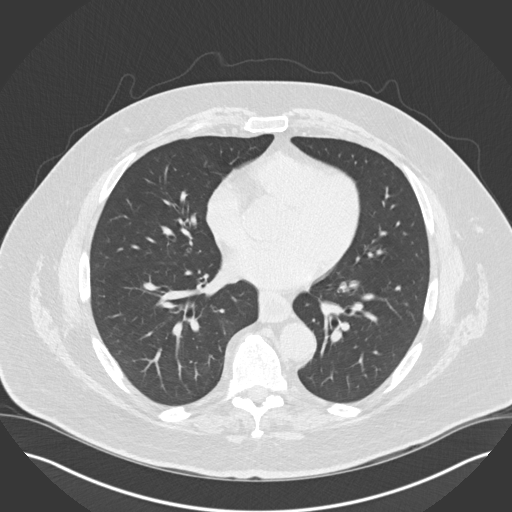
[im 86/159  lung]
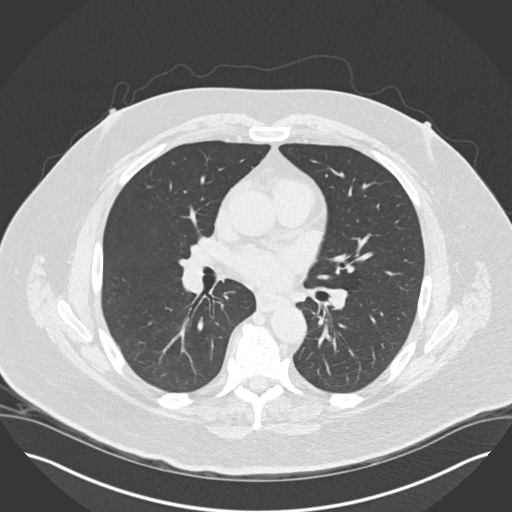
[im 98/159  lung]
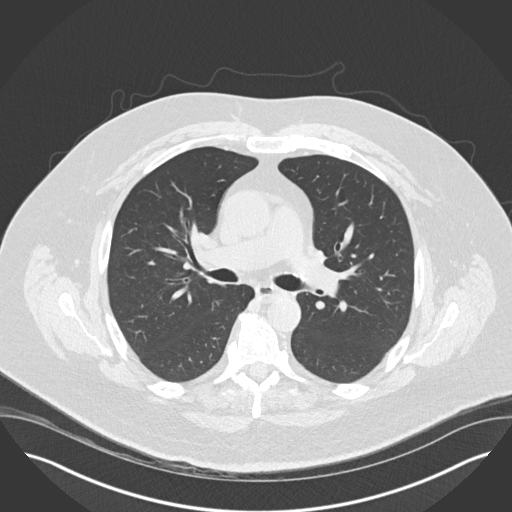
[im 110/159  mediastinal]
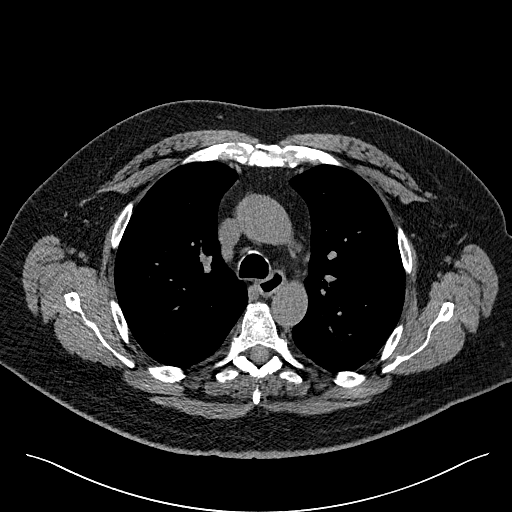
[im 110/159  lung]
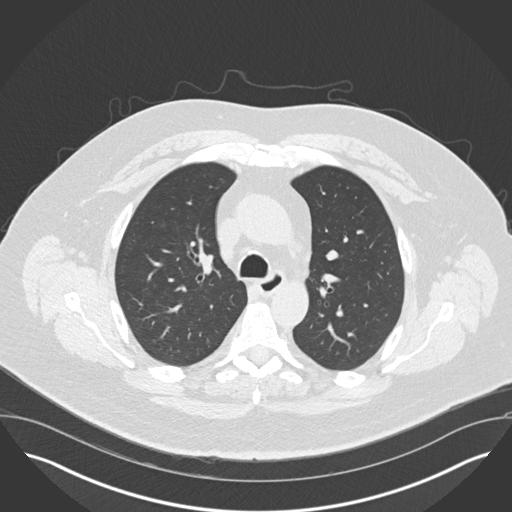
[im 122/159  lung]
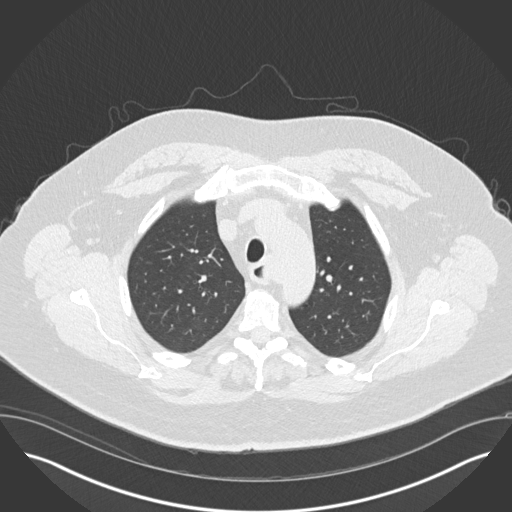
[im 134/159  lung]
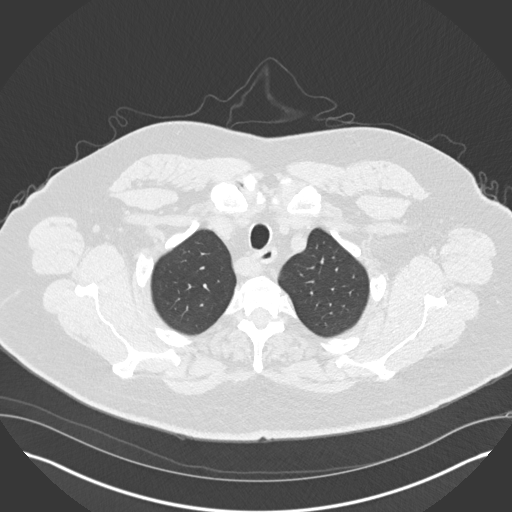
[im 146/159  lung]
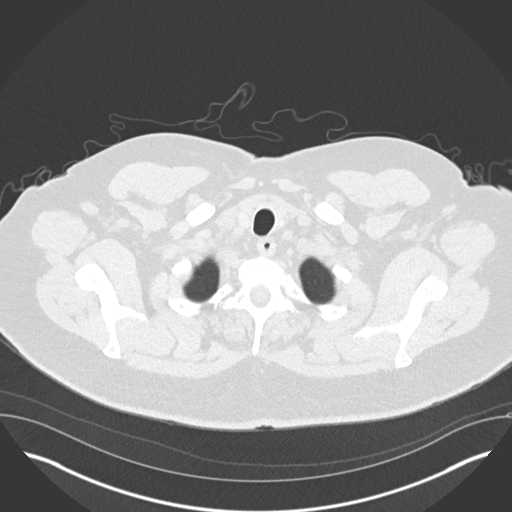

[Series 5: coronal · coronal · 0.62mm/px · 3 of 162 slices shown]
[im 33/162  lung]
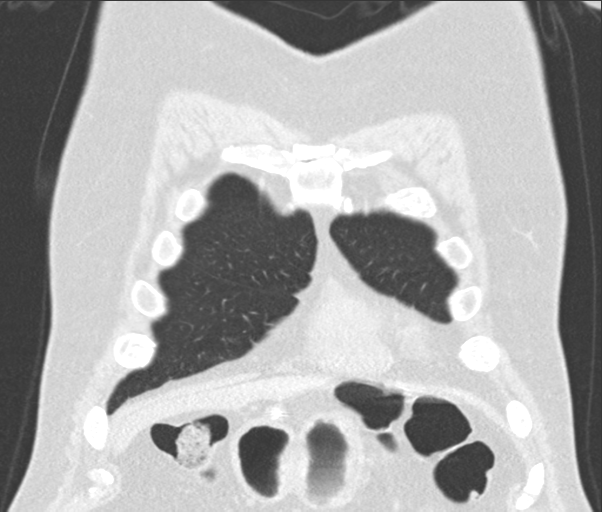
[im 65/162  lung]
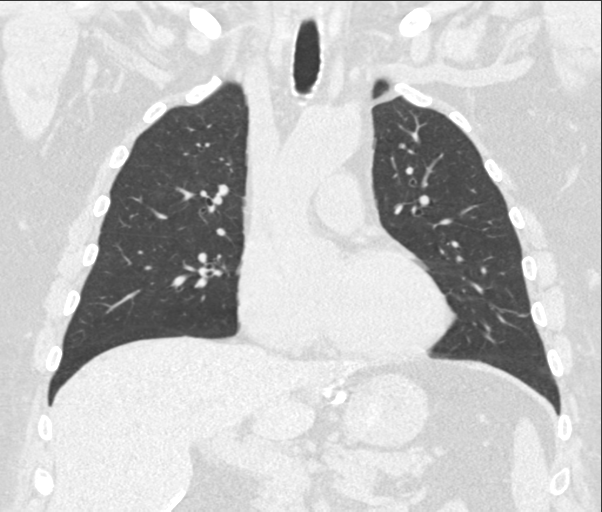
[im 97/162  lung]
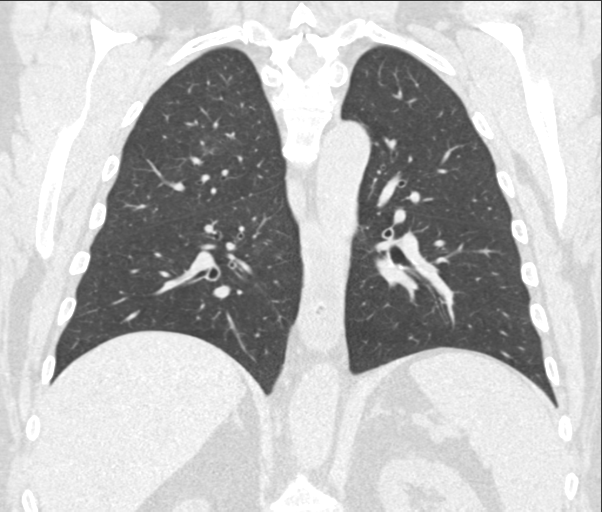

[15 of 36 positions shown; findings below may reference images not displayed]

FINDINGS: Cardiovascular: The heart size appears within normal limits. No
pericardial effusion. Aberrant right subclavian artery. Mild aortic
atherosclerosis.

Mediastinum/Nodes: Normal appearance of the thyroid gland. The
trachea appears patent and is midline. Circumferential wall
thickening of the distal esophagus is noted just above patient's
gastric band.

No enlarged lymph nodes.

Lungs/Pleura: No pleural effusion identified. No airspace
consolidation, atelectasis, or pneumothorax. Previously noted right
upper lobe ground-glass nodule has resolved in the interval. Solid
nodule within the right lower lobe measures 4 mm and is unchanged
from previous exam, image 85/3. Posteromedial left lower lobe lung
nodule measures 3 mm, image 91/3.

Upper Abdomen: No acute abnormality.  Status post gastric banding.

Musculoskeletal: No chest wall mass or suspicious bone lesions
identified. Thoracic degenerative disc disease identified.
IMPRESSION: 1. Stable appearance of small lower lobe lung nodules which measure
up to 4 mm. No follow-up needed if patient is low-risk (and has no
known or suspected primary neoplasm). Non-contrast chest CT can be
considered at 12 months (from 08/01/2019) if patient is high-risk.
This recommendation follows the consensus statement: Guidelines for
Management of Incidental Pulmonary Nodules Detected on CT Images:
2. Interval resolution of previous right upper lobe sub solid nodule
compatible with a benign.
3. Aberrant right subclavian artery.
4. Again noted is circumferential wall thickening of the distal
esophagus is noted just above patient's gastric band. Finding is
nonspecific but may represent underlying esophagitis.

Aortic Atherosclerosis ([id number]-QL0.0).

## 2022-10-29 ENCOUNTER — Observation Stay (HOSPITAL_BASED_OUTPATIENT_CLINIC_OR_DEPARTMENT_OTHER)
Admission: EM | Admit: 2022-10-29 | Discharge: 2022-11-01 | Disposition: A | Discharge status: Home / Self Care | Payer: BC Managed Care – PPO | Attending: Internal Medicine | Admitting: Internal Medicine

## 2022-10-29 ENCOUNTER — Other Ambulatory Visit: Payer: Self-pay

## 2022-10-29 ENCOUNTER — Encounter (HOSPITAL_BASED_OUTPATIENT_CLINIC_OR_DEPARTMENT_OTHER): Payer: Self-pay

## 2022-10-29 DIAGNOSIS — E785 Hyperlipidemia, unspecified: Secondary | ICD-10-CM | POA: Diagnosis present

## 2022-10-29 DIAGNOSIS — K625 Hemorrhage of anus and rectum: Secondary | ICD-10-CM | POA: Diagnosis not present

## 2022-10-29 DIAGNOSIS — K922 Gastrointestinal hemorrhage, unspecified: Secondary | ICD-10-CM | POA: Diagnosis not present

## 2022-10-29 DIAGNOSIS — Z87891 Personal history of nicotine dependence: Secondary | ICD-10-CM | POA: Diagnosis not present

## 2022-10-29 DIAGNOSIS — Z7982 Long term (current) use of aspirin: Secondary | ICD-10-CM | POA: Insufficient documentation

## 2022-10-29 DIAGNOSIS — Z79899 Other long term (current) drug therapy: Secondary | ICD-10-CM | POA: Diagnosis not present

## 2022-10-29 DIAGNOSIS — I10 Essential (primary) hypertension: Secondary | ICD-10-CM

## 2022-10-29 DIAGNOSIS — K648 Other hemorrhoids: Secondary | ICD-10-CM

## 2022-10-29 DIAGNOSIS — Z8719 Personal history of other diseases of the digestive system: Secondary | ICD-10-CM

## 2022-10-29 DIAGNOSIS — F411 Generalized anxiety disorder: Secondary | ICD-10-CM | POA: Insufficient documentation

## 2022-10-29 DIAGNOSIS — K641 Second degree hemorrhoids: Secondary | ICD-10-CM

## 2022-10-29 LAB — CBC
HCT: 42.8 % (ref 39.0–52.0)
Hemoglobin: 14.2 g/dL (ref 13.0–17.0)
MCH: 29 pg (ref 26.0–34.0)
MCHC: 33.2 g/dL (ref 30.0–36.0)
MCV: 87.3 fL (ref 80.0–100.0)
Platelets: 240 10*3/uL (ref 150–400)
RBC: 4.9 MIL/uL (ref 4.22–5.81)
RDW: 13.4 % (ref 11.5–15.5)
WBC: 11.4 10*3/uL — ABNORMAL HIGH (ref 4.0–10.5)
nRBC: 0 % (ref 0.0–0.2)

## 2022-10-29 LAB — COMPREHENSIVE METABOLIC PANEL
ALT: 15 U/L (ref 0–44)
AST: 13 U/L — ABNORMAL LOW (ref 15–41)
Albumin: 4.5 g/dL (ref 3.5–5.0)
Alkaline Phosphatase: 49 U/L (ref 38–126)
Anion gap: 9 (ref 5–15)
BUN: 14 mg/dL (ref 8–23)
CO2: 25 mmol/L (ref 22–32)
Calcium: 9.5 mg/dL (ref 8.9–10.3)
Chloride: 106 mmol/L (ref 98–111)
Creatinine, Ser: 0.83 mg/dL (ref 0.61–1.24)
GFR, Estimated: >60 mL/min (ref >60)
Glucose, Bld: 109 mg/dL — ABNORMAL HIGH (ref 70–99)
Potassium: 3.9 mmol/L (ref 3.5–5.1)
Sodium: 140 mmol/L (ref 135–145)
Total Bilirubin: 0.5 mg/dL (ref 0.3–1.2)
Total Protein: 6.9 g/dL (ref 6.5–8.1)

## 2022-10-29 LAB — OCCULT BLOOD X 1 CARD TO LAB, STOOL: Fecal Occult Bld: POSITIVE — AB

## 2022-10-29 MED ORDER — LACTATED RINGERS IV SOLN: INTRAVENOUS | Status: AC

## 2022-10-29 MED ORDER — ONDANSETRON HCL 4 MG PO TABS: 4.0000 mg | ORAL_TABLET | Freq: Four times a day (QID) | ORAL | Status: DC | PRN

## 2022-10-29 MED ORDER — ONDANSETRON HCL 4 MG/2ML IJ SOLN: 4.0000 mg | Freq: Four times a day (QID) | INTRAMUSCULAR | Status: DC | PRN

## 2022-10-29 MED ORDER — ACETAMINOPHEN 650 MG RE SUPP: 650.0000 mg | Freq: Four times a day (QID) | RECTAL | Status: DC | PRN

## 2022-10-29 MED ORDER — ACETAMINOPHEN 325 MG PO TABS
650.0000 mg | ORAL_TABLET | Freq: Four times a day (QID) | ORAL | Status: DC | PRN
  Administered 2022-10-30: 650 mg via ORAL
  Filled 2022-10-29: qty 2

## 2022-10-29 MED ORDER — ROSUVASTATIN CALCIUM 5 MG PO TABS
10.0000 mg | ORAL_TABLET | Freq: Every day | ORAL | Status: DC
  Administered 2022-10-30 – 2022-11-01 (×3): 10 mg via ORAL
  Filled 2022-10-29 (×3): qty 2

## 2022-10-29 MED ORDER — BUPROPION HCL ER (XL) 150 MG PO TB24
150.0000 mg | ORAL_TABLET | Freq: Every day | ORAL | Status: DC
  Administered 2022-10-30 – 2022-11-01 (×3): 150 mg via ORAL
  Filled 2022-10-29 (×3): qty 1

## 2022-10-29 MED ORDER — PANTOPRAZOLE SODIUM 40 MG IV SOLR
40.0000 mg | INTRAVENOUS | Status: DC
  Administered 2022-10-29 – 2022-10-30 (×2): 40 mg via INTRAVENOUS
  Filled 2022-10-29 (×2): qty 10

## 2022-10-29 MED ORDER — HYDRALAZINE HCL 20 MG/ML IJ SOLN: 10.0000 mg | Freq: Three times a day (TID) | INTRAMUSCULAR | Status: DC | PRN

## 2022-10-29 NOTE — H&P (Deleted)
History and Physical    Johnathan Sullivan UXL:[id number] DOB: 05-21-1960 DOA: 10/29/2022  PCP: [name], MD   Patient coming from: Home   Chief Complaint:  Chief Complaint  Patient presents with   Rectal Bleeding    HPI:  62 year old man medical history of hypertension, hyperlipidemia, asymmetrical basal hypertrophy of left ventricle without any obstruction and preserved EF 60 to 65%, longstanding hemorrhoid and former smoker who has presented to emergency department at [facility] with complaining of bright red blood with the stool.  Patient reported he was using the bathroom in the morning while noticed large volume of bright red blood per rectum.  Patient reported loose stool for last 3 days.  He was also feeling lightheaded.  Denies any chest pain shortness of breath.  Denies any syncope presyncope.  Endorsed abdominal cramping just prior to event now improving.  No previous history of bleeding per rectum or with the stool. ED physician has spoken with a list Dr. Alinda Money at [facility] and patient was transferred over here for admission and further workup with GI.  Patient reported previous colonoscopy 6 years ago which did not showed any evidence of polyp or hemorrhoid.   ED Course:  Initial presentation to ED heart rate 72, respiratory 16, blood pressure 140/95 and O2 sat 100% room air. Lab workup of fecal occult positive with blood.  CBC WBC 11.4, RBC 4.9, hemoglobin 14.1 and platelet 240. CMP sodium 140, potassium 3.9, chloride 106, bicarb 25, blood glucose 109, BUN 14, creatinine 0.83.   Review of Systems:  Review of Systems  Constitutional:  Negative for chills, fever, malaise/fatigue and weight loss.  Respiratory:  Negative for cough.   Cardiovascular:  Negative for chest pain and palpitations.  Gastrointestinal:  Negative for abdominal pain, diarrhea, heartburn, nausea and vomiting.  Neurological:  Negative for dizziness and headaches.   Psychiatric/Behavioral:  The patient is not nervous/anxious.     Past Medical History:  Diagnosis Date   Allergy    Hyperlipidemia    Labile hypertension    Unspecified vitamin D deficiency     Past Surgical History:  Procedure Laterality Date   HEMORRHOID BANDING  09/2011   LAPAROSCOPIC GASTRIC BANDING  2009     reports that he quit smoking about 29 years ago. He started smoking about 44 years ago. He has a 30 pack-year smoking history. He has never used smokeless tobacco. He reports that he does not drink alcohol and does not use drugs.  No Known Allergies  Family History  Problem Relation Age of Onset   Cancer Mother        Breast   Hypertension Mother    Hypertension Father    Heart attack Father    Arthritis Father    CAD Brother    CAD Paternal Grandfather     Prior to Admission medications   Medication Sig Start Date End Date Taking? Authorizing Provider  aspirin 81 MG tablet Take 81 mg by mouth daily.    [provider]  buPROPion (WELLBUTRIN XL) 150 MG 24 hr tablet Take 1 tablet by mouth daily. 01/09/19   [provider]  Cholecalciferol (VITAMIN D3) 5000 UNITS CAPS Take 5,000 Units by mouth daily.    [provider]  Glucosamine HCl-MSM 750-750 MG TABS Take by mouth daily.    [provider]  lisinopril (ZESTRIL) 10 MG tablet Take 1 tablet by mouth daily. 10/13/18   [provider]  Multiple Vitamin (MULTIVITAMIN) tablet Take  1 tablet by mouth daily.    [provider]  pantoprazole (PROTONIX) 40 MG tablet Take 1 tablet by mouth daily. 06/23/16   [provider]  rosuvastatin (CRESTOR) 10 MG tablet Take 10 mg by mouth daily.    [provider]     Physical Exam: Vitals:   10/29/22 1800 10/29/22 2000 10/29/22 2033 10/29/22 2152  BP: 117/72 118/72  116/79  Pulse: 65 64  (!) 55  Resp:  16    Temp:   98.4 F (36.9 C) 98.3 F (36.8 C)  TempSrc:   Oral Oral  SpO2: 97% 97%  99%  Weight:       Height:        Physical Exam Constitutional:      General: He is not in acute distress.    Appearance: He is not ill-appearing.  HENT:     Mouth/Throat:     Mouth: Mucous membranes are moist.  Cardiovascular:     Rate and Rhythm: Normal rate and regular rhythm.     Pulses: Normal pulses.     Heart sounds: Normal heart sounds.  Pulmonary:     Effort: Pulmonary effort is normal.     Breath sounds: Normal breath sounds.  Abdominal:     General: Bowel sounds are normal. There is no distension.     Palpations: There is no mass.     Tenderness: There is no abdominal tenderness. There is no guarding or rebound.     Hernia: No hernia is present.  Musculoskeletal:     Cervical back: Neck supple.     Right lower leg: No edema.     Left lower leg: No edema.  Skin:    General: Skin is warm.     Capillary Refill: Capillary refill takes less than 2 seconds.  Neurological:     Mental Status: He is alert and oriented to person, place, and time.  Psychiatric:        Mood and Affect: Mood normal.      Labs on Admission: I have personally reviewed following labs and imaging studies  CBC: Recent Labs  Lab 10/29/22 1351  WBC 11.4*  HGB 14.2  HCT 42.8  MCV 87.3  PLT 240   Basic Metabolic Panel: Recent Labs  Lab 10/29/22 1351  NA 140  K 3.9  CL 106  CO2 25  GLUCOSE 109*  BUN 14  CREATININE 0.83  CALCIUM 9.5   GFR: Estimated Creatinine Clearance: 117.9 mL/min (by C-G formula based on SCr of 0.83 mg/dL). Liver Function Tests: Recent Labs  Lab 10/29/22 1351  AST 13*  ALT 15  ALKPHOS 49  BILITOT 0.5  PROT 6.9  ALBUMIN 4.5   No results for input(s): "LIPASE", "AMYLASE" in the last 168 hours. No results for input(s): "AMMONIA" in the last 168 hours. Coagulation Profile: No results for input(s): "INR", "PROTIME" in the last 168 hours. Cardiac Enzymes: No results for input(s): "CKTOTAL", "CKMB", "CKMBINDEX", "TROPONINI", "TROPONINIHS" in the last 168 hours. BNP  (last 3 results) No results for input(s): "BNP" in the last 8760 hours. HbA1C: No results for input(s): "HGBA1C" in the last 72 hours. CBG: No results for input(s): "GLUCAP" in the last 168 hours. Lipid Profile: No results for input(s): "CHOL", "HDL", "LDLCALC", "TRIG", "CHOLHDL", "LDLDIRECT" in the last 72 hours. Thyroid Function Tests: No results for input(s): "TSH", "T4TOTAL", "FREET4", "T3FREE", "THYROIDAB" in the last 72 hours. Anemia Panel: No results for input(s): "VITAMINB12", "FOLATE", "FERRITIN", "TIBC", "IRON", "RETICCTPCT" in the last  72 hours. Urine analysis:    Component Value Date/Time   COLORURINE YELLOW 09/18/2014 1110   APPEARANCEUR CLEAR 09/18/2014 1110   LABSPEC <1.005 (L) 09/18/2014 1110   PHURINE 7.5 09/18/2014 1110   GLUCOSEU NEG 09/18/2014 1110   HGBUR NEG 09/18/2014 1110   BILIRUBINUR NEG 09/18/2014 1110   KETONESUR NEG 09/18/2014 1110   PROTEINUR NEG 09/18/2014 1110   UROBILINOGEN 0.2 09/18/2014 1110   NITRITE NEG 09/18/2014 1110   LEUKOCYTESUR NEG 09/18/2014 1110    Radiological Exams on Admission: I have personally reviewed images No results found.    Assessment/Plan: Principal Problem:   Bright red blood per rectum Active Problems:   Essential hypertension   Hyperlipidemia   History of hemorrhoids   GAD (generalized anxiety disorder)    Assessment and Plan: Bright red blood per rectum History of hemorrhoid - Patient reported 3 episodes of loose stool with associated blood mixed with the stool for last 24 hours associated with abdominal pain and cramping which has been subsided.  No previous history of blood in the stool.  History of hemorrhoid without any previous bleeding.  Patient reported previous colonoscopy in the past however unable to find records on the chart.  Per patient no history of polyp and hemorrhoid on colonoscopy. - Hemodynamically stable and hemoglobin 14 around 2 PM 10/29/2022 - Gastroenterologist has been consulted while  patient was at [facility] emergency department. - Concern for hemorrhoid bleeding.  type and screen.  Checking stat CBC -Keeping patient n.p.o. and continue IV fluid LR 75 cc/h - Continue IV Protonix. - No rectal bleeding since patient in the hospital.  Given patient is hemodynamically stable with hemoglobin 14 will reach out to GI in the morning. - Consulted GI.  Appreciate input - Will keep patient n.p.o. overnight.  Essential hypertension - Blood pressure is well-controlled and heart rate 55. - Holding home lisinopril as blood pressure is borderline low.   Hyperlipidemia - Resumed Crestor 10 mg daily   Generalized anxiety disorder - Resumed Wellbutrin XL 150 mg daily.   DVT prophylaxis:    SCDs Code Status:    Full Code Diet:    Current keeping patient NPO. Family Communication:    Discussed treatment plan with patient. Disposition Plan:   Plan to discharge home in 2 to 3 days.   Consults: Solicitor ([facility] unassigned  GI) Admission status:   Inpatient, Telemetry bed  Severity of Illness: The appropriate patient status for this patient is INPATIENT. Inpatient status is judged to be reasonable and necessary in order to provide the required intensity of service to ensure the patient's safety. The patient's presenting symptoms, physical exam findings, and initial radiographic and laboratory data in the context of their chronic comorbidities is felt to place them at high risk for further clinical deterioration. Furthermore, it is not anticipated that the patient will be medically stable for discharge from the hospital within 2 midnights of admission.   * I certify that at the point of admission it is my clinical judgment that the patient will require inpatient hospital care spanning beyond 2 midnights from the point of admission due to high intensity of service, high risk for further deterioration and high frequency of surveillance required.Marland Kitchen    Tereasa Coop MD [facility]  How to contact the [facility] Attending or Consulting provider 7A - 7P or covering provider during after hours 7P -7A, for this patient?   Check the care team in [facility] and look for a) attending/consulting [facility] provider listed and  b) the [facility] team listed Log into [email or web address] and use [facility] universal password to access. If you do not have the password, please contact the hospital operator. Locate the [facility] provider you are looking for under [facility] and page to a number that you can be directly reached. If you still have difficulty reaching the provider, please page the [facility] (Director on Call) for the Hospitalists listed on amion for assistance.  10/29/2022, 11:01 PM

## 2022-10-29 NOTE — ED Provider Notes (Signed)
[place] EMERGENCY DEPARTMENT AT [facility] Provider Note   CSN: [id number] Arrival date & time: 10/29/22  1330     History Chief Complaint  Patient presents with   Rectal Bleeding    HPI Johnathan Sullivan is a 62 y.o. male presenting for chief complaint of bright red blood per rectum.  He is a 62 year old male with a history of hypertension and hyperlipidemia.  He states that while he was using the restroom this morning he had a large volume bright red blood per rectal event. States yesterday he had a diarrheal illness with 3 episodes of liquid stool. He feels fatigued lightheaded.  He denies chest pain or shortness of breath.  Denies syncope.  Otherwise ambulatory with minimal p.o. tolerance today. Endorses abdominal cramping just prior to the event now improving.  States it was 2 episodes throughout the day today. No history of similar.  Has a longstanding history of hemorrhoids but they typically do not bleed. States that he has colonoscopy when he was 70 with [facility] but has moved to [place] in the interim.  Patient's recorded medical, surgical, social, medication list and allergies were reviewed in the Snapshot window as part of the initial history.   Review of Systems   Review of Systems  Constitutional:  Negative for chills and fever.  HENT:  Negative for ear pain and sore throat.   Eyes:  Negative for pain and visual disturbance.  Respiratory:  Negative for cough and shortness of breath.   Cardiovascular:  Negative for chest pain and palpitations.  Gastrointestinal:  Positive for blood in stool. Negative for abdominal pain and vomiting.  Genitourinary:  Negative for dysuria and hematuria.  Musculoskeletal:  Negative for arthralgias and back pain.  Skin:  Negative for color change and rash.  Neurological:  Negative for seizures and syncope.  All other systems reviewed and are negative.   Physical Exam Updated Vital Signs BP 116/79 (BP Location: Left  Arm)   Pulse (!) 55   Temp 98.3 F (36.8 C) (Oral)   Resp 16   Ht 5\' 10"  (1.778 m)   Wt 113.4 kg   SpO2 99%   BMI 35.87 kg/m  Physical Exam Vitals and nursing note reviewed.  Constitutional:      General: He is not in acute distress.    Appearance: He is well-developed.  HENT:     Head: Normocephalic and atraumatic.  Eyes:     Conjunctiva/sclera: Conjunctivae normal.  Cardiovascular:     Rate and Rhythm: Normal rate and regular rhythm.     Heart sounds: No murmur heard. Pulmonary:     Effort: Pulmonary effort is normal. No respiratory distress.     Breath sounds: Normal breath sounds.  Abdominal:     Palpations: Abdomen is soft.     Tenderness: There is no abdominal tenderness.  Genitourinary:    Comments: Pink mucousy stool. Musculoskeletal:        General: No swelling.     Cervical back: Neck supple.  Skin:    General: Skin is warm and dry.     Capillary Refill: Capillary refill takes less than 2 seconds.  Neurological:     Mental Status: He is alert.  Psychiatric:        Mood and Affect: Mood normal.      ED Course/ Medical Decision Making/ A&P    Procedures Procedures   Medications Ordered in ED Medications  rosuvastatin (CRESTOR) tablet 10 mg (has no administration in time range)  buPROPion (WELLBUTRIN XL) 24 hr tablet 150 mg (has no administration in time range)  pantoprazole (PROTONIX) injection 40 mg (has no administration in time range)  lactated ringers infusion (has no administration in time range)  acetaminophen (TYLENOL) tablet 650 mg (has no administration in time range)    Or  acetaminophen (TYLENOL) suppository 650 mg (has no administration in time range)  ondansetron (ZOFRAN) tablet 4 mg (has no administration in time range)    Or  ondansetron (ZOFRAN) injection 4 mg (has no administration in time range)  hydrALAZINE (APRESOLINE) injection 10 mg (has no administration in time range)    Medical Decision Making:    Johnathan Sullivan is a 62  y.o. male who presented to the ED today with bright red blood per rectum detailed above.     Additional history discussed with patient's family/caregivers.  Patient placed on continuous vitals and telemetry monitoring while in ED which was reviewed periodically.   Complete initial physical exam performed, notably the patient  was hemodynamically stable no acute distress.  His rectal exam demonstrated mucus covered stool with light red coating on the outside.  No large-volume bleeding no gross melena..      Reviewed and confirmed nursing documentation for past medical history, family history, social history.    Initial Assessment:   With the patient's presentation of bright red blood per rectum, most likely diagnosis is lower GI bleed such as diverticular versus AVM. Other diagnoses were considered including (but not limited to) infectious etiology such as Salmonella, Shigella, enteric coccal or norovirus. These are considered less likely due to history of present illness and physical exam findings.   This is most consistent with an acute life/limb threatening illness complicated by underlying chronic conditions.  Initial Plan:  Considered CTA mesenteric study, however patient does not have a large volume of blood inside of his rectal vault at this time.  Will start with screening labs as below and plan for reassessment. Screening labs including CBC and Metabolic panel to evaluate for infectious or metabolic etiology of disease.  Objective evaluation as below reviewed with plan for close reassessment  Initial Study Results:   Laboratory  All laboratory results reviewed without evidence of clinically relevant pathology.   Exception includes positive Hemoccult  Consults:  Case discussed with gastroenterology on-call.   Reassessment and Plan:   Planning for reassessment and admission. On reassessment patient him inStable no acute distress.  Gastroenterology was consulted who agreed with need  for admission.  Patient arranged for admission with hospitalist  For serial exams, possible consideration for GI evaluation/scoping tomorrow.   Clinical Impression:  1. Lower GI bleed      Admit   Final Clinical Impression(s) / ED Diagnoses Final diagnoses:  Lower GI bleed    Rx / DC Orders ED Discharge Orders     None         Glyn Ade, MD 10/29/22 2344

## 2022-10-29 NOTE — ED Notes (Signed)
Report called to nurse Patty on accepting unit. Awaiting [facility] transportation. Patient resting quietly in stretcher, respirations even, unlabored, no acute distress noted. Denies needs at this time.

## 2022-10-29 NOTE — H&P (Addendum)
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  Patient reported he was using the bathroom in the morning while noticed large volume of bright red blood per rectum.  Patient reported loose stool for last 3 days.  He was also feeling lightheaded.  Denies any chest pain shortness of breath.  Denies any syncope presyncope.  Endorsed abdominal cramping just prior to event now improving.  No previous history of bleeding per rectum or with the stool. ED physician has spoken with gastroenterologist on-call and hospitalist Dr. Alinda Money at [facility] and patient was transferred over here for admission and further workup with GI.  Patient reported previous colonoscopy 6 years ago which did not showed any evidence of polyp or hemorrhoid.   ED Course:  Initial presentation to ED heart rate 72, respiratory 16, blood pressure 140/95 and O2 sat 100% room air. Lab workup of fecal occult positive with blood.  CBC WBC 11.4, RBC 4.9, hemoglobin 14.1 and platelet 240. CMP sodium 140, potassium 3.9, chloride 106, bicarb 25, blood glucose 109, BUN 14, creatinine 0.83.   Review of Systems:  Review of Systems  Constitutional:  Negative for chills, fever, malaise/fatigue and weight loss.  Respiratory:  Negative for cough.   Cardiovascular:  Negative for chest pain and palpitations.  Gastrointestinal:  Negative for abdominal pain, diarrhea, heartburn, nausea and vomiting.  Neurological:  Negative for  dizziness and headaches.  Psychiatric/Behavioral:  The patient is not nervous/anxious.     Past Medical History:  Diagnosis Date   Allergy    Hyperlipidemia    Labile hypertension    Unspecified vitamin D deficiency     Past Surgical History:  Procedure Laterality Date   HEMORRHOID BANDING  09/2011   LAPAROSCOPIC GASTRIC BANDING  2009     reports that he quit smoking about 29 years ago. He started smoking about 44 years ago. He has a 30 pack-year smoking history. He has never used smokeless tobacco. He reports that he does not drink alcohol and does not use drugs.  No Known Allergies  Family History  Problem Relation Age of Onset   Cancer Mother        Breast   Hypertension Mother    Hypertension Father    Heart attack Father    Arthritis Father    CAD Brother    CAD Paternal Grandfather     Prior to Admission medications   Medication Sig Start Date End Date Taking? Authorizing Provider  aspirin 81 MG tablet Take 81 mg by mouth daily.    [provider]  buPROPion (WELLBUTRIN XL) 150 MG 24 hr tablet Take 1 tablet by mouth daily. 01/09/19   [provider]  Cholecalciferol (VITAMIN D3) 5000 UNITS CAPS Take 5,000 Units by mouth daily.    [provider]  Glucosamine HCl-MSM 750-750 MG TABS Take by mouth daily.    [provider]  lisinopril (ZESTRIL) 10 MG tablet Take 1 tablet by mouth daily. 10/13/18   [provider]  Multiple Vitamin (MULTIVITAMIN)  tablet Take 1 tablet by mouth daily.    [provider]  pantoprazole (PROTONIX) 40 MG tablet Take 1 tablet by mouth daily. 06/23/16   [provider]  rosuvastatin (CRESTOR) 10 MG tablet Take 10 mg by mouth daily.    [provider]     Physical Exam: Vitals:   10/29/22 1800 10/29/22 2000 10/29/22 2033 10/29/22 2152  BP: 117/72 118/72  116/79  Pulse: 65 64  (!) 55  Resp:  16    Temp:   98.4 F (36.9 C) 98.3 F (36.8 C)  TempSrc:   Oral Oral  SpO2:  97% 97%  99%  Weight:      Height:        Physical Exam Constitutional:      General: He is not in acute distress.    Appearance: He is not ill-appearing.  HENT:     Mouth/Throat:     Mouth: Mucous membranes are moist.  Cardiovascular:     Rate and Rhythm: Normal rate and regular rhythm.     Pulses: Normal pulses.     Heart sounds: Normal heart sounds.  Pulmonary:     Effort: Pulmonary effort is normal.     Breath sounds: Normal breath sounds.  Abdominal:     General: Bowel sounds are normal. There is no distension.     Palpations: There is no mass.     Tenderness: There is no abdominal tenderness. There is no guarding or rebound.     Hernia: No hernia is present.  Musculoskeletal:     Cervical back: Neck supple.     Right lower leg: No edema.     Left lower leg: No edema.  Skin:    General: Skin is warm.     Capillary Refill: Capillary refill takes less than 2 seconds.  Neurological:     Mental Status: He is alert and oriented to person, place, and time.  Psychiatric:        Mood and Affect: Mood normal.      Labs on Admission: I have personally reviewed following labs and imaging studies  CBC: Recent Labs  Lab 10/29/22 1351  WBC 11.4*  HGB 14.2  HCT 42.8  MCV 87.3  PLT 240   Basic Metabolic Panel: Recent Labs  Lab 10/29/22 1351  NA 140  K 3.9  CL 106  CO2 25  GLUCOSE 109*  BUN 14  CREATININE 0.83  CALCIUM 9.5   GFR: Estimated Creatinine Clearance: 117.9 mL/min (by C-G formula based on SCr of 0.83 mg/dL). Liver Function Tests: Recent Labs  Lab 10/29/22 1351  AST 13*  ALT 15  ALKPHOS 49  BILITOT 0.5  PROT 6.9  ALBUMIN 4.5   No results for input(s): "LIPASE", "AMYLASE" in the last 168 hours. No results for input(s): "AMMONIA" in the last 168 hours. Coagulation Profile: No results for input(s): "INR", "PROTIME" in the last 168 hours. Cardiac Enzymes: No results for input(s): "CKTOTAL", "CKMB", "CKMBINDEX", "TROPONINI", "TROPONINIHS" in  the last 168 hours. BNP (last 3 results) No results for input(s): "BNP" in the last 8760 hours. HbA1C: No results for input(s): "HGBA1C" in the last 72 hours. CBG: No results for input(s): "GLUCAP" in the last 168 hours. Lipid Profile: No results for input(s): "CHOL", "HDL", "LDLCALC", "TRIG", "CHOLHDL", "LDLDIRECT" in the last 72 hours. Thyroid Function Tests: No results for input(s): "TSH", "T4TOTAL", "FREET4", "T3FREE", "THYROIDAB" in the last 72 hours. Anemia Panel: No results for input(s): "VITAMINB12", "FOLATE", "FERRITIN", "TIBC", "IRON", "RETICCTPCT" in  the last 72 hours. Urine analysis:    Component Value Date/Time   COLORURINE YELLOW 09/18/2014 1110   APPEARANCEUR CLEAR 09/18/2014 1110   LABSPEC <1.005 (L) 09/18/2014 1110   PHURINE 7.5 09/18/2014 1110   GLUCOSEU NEG 09/18/2014 1110   HGBUR NEG 09/18/2014 1110   BILIRUBINUR NEG 09/18/2014 1110   KETONESUR NEG 09/18/2014 1110   PROTEINUR NEG 09/18/2014 1110   UROBILINOGEN 0.2 09/18/2014 1110   NITRITE NEG 09/18/2014 1110   LEUKOCYTESUR NEG 09/18/2014 1110    Radiological Exams on Admission: I have personally reviewed images No results found.    Assessment/Plan: Principal Problem:   Bright red blood per rectum Active Problems:   Essential hypertension   Hyperlipidemia   History of hemorrhoids    Assessment and Plan: Lower GI bleed Bright red blood per rectum History of hemorrhoid - Patient reported 3 episodes of loose stool with associated blood mixed with the stool for last 24 hours associated with abdominal pain and cramping which has been subsided.  No previous history of blood in the stool.  History of hemorrhoid without any previous bleeding.  Patient reported previous colonoscopy in the past however unable to find records on the chart.  Per patient no history of polyp and hemorrhoid on colonoscopy. - Hemodynamically stable and hemoglobin 14 around 2 PM 10/29/2022 - Gastroenterologist has been consulted  while patient was at [facility] emergency department. - Concern for hemorrhoid bleeding.  type and screen.  Checking stat CBC -Keeping patient n.p.o. and continue IV fluid LR 75 cc/h - Continue IV Protonix. -Continue to monitor H&H 3 times daily for next 24 hours and transfuse if hemoglobin drops less than 7 or hemodynamic instability in the setting of active GI bleed. - No rectal bleeding since patient in the hospital.  Given patient is hemodynamically stable with hemoglobin 14 will reach out to GI in the morning. - Consulted GI.  Appreciate input - Will keep patient n.p.o. overnight.  Essential hypertension - Blood pressure is well-controlled and heart rate 55. - Holding home lisinopril as blood pressure is borderline low.   Hyperlipidemia - Resumed Crestor 10 mg daily   Generalized anxiety disorder - Resumed Wellbutrin XL 150 mg daily.   DVT prophylaxis:    SCDs Code Status:    Full Code Diet:    Current keeping patient NPO. Family Communication:    Discussed treatment plan with patient. Disposition Plan:   Plan to discharge home in 2 to 3 days.   Consults: Solicitor ([facility] unassigned [facility]) Admission status:   Inpatient, Telemetry bed  Severity of Illness: The appropriate patient status for this patient is INPATIENT. Inpatient status is judged to be reasonable and necessary in order to provide the required intensity of service to ensure the patient's safety. The patient's presenting symptoms, physical exam findings, and initial radiographic and laboratory data in the context of their chronic comorbidities is felt to place them at high risk for further clinical deterioration. Furthermore, it is not anticipated that the patient will be medically stable for discharge from the hospital within 2 midnights of admission.   * I certify that at the point of admission it is my clinical judgment that the patient will require inpatient hospital care spanning beyond 2 midnights  from the point of admission due to high intensity of service, high risk for further deterioration and high frequency of surveillance required.Marland Kitchen    Tereasa Coop MD [facility]  How to contact the [facility] Attending or Consulting provider 7A - 7P  or covering provider during after hours 7P -7A, for this patient?   Check the care team in [facility] and look for a) attending/consulting [facility] provider listed and b) the [facility] team listed Log into [email or web address] and use [place]'s universal password to access. If you do not have the password, please contact the hospital operator. Locate the [facility] provider you are looking for under [facility] and page to a number that you can be directly reached. If you still have difficulty reaching the provider, please page the [facility] (Director on Call) for the Hospitalists listed on amion for assistance.  10/29/2022, 10:53 PM

## 2022-10-29 NOTE — ED Triage Notes (Signed)
Patient here POV from Home.  Notes Diarrhea and Cramping that began yesterday. Noted Blood when attempting to have a BM twice today.   No More Diarrhea. No N/V. No Fevers. No Pain. No Anticoagulants.   NAD Noted during Triage. A&Ox4. GCS 15. Ambulatory.

## 2022-10-29 NOTE — ED Notes (Signed)
[facility] here for patient transport. 

## 2022-10-29 NOTE — Progress Notes (Signed)
Pt admitted for restal bleed. Denies pain at this time. VS WNL. Independent, on RA.

## 2022-10-30 DIAGNOSIS — E7800 Pure hypercholesterolemia, unspecified: Secondary | ICD-10-CM | POA: Diagnosis not present

## 2022-10-30 DIAGNOSIS — F411 Generalized anxiety disorder: Secondary | ICD-10-CM

## 2022-10-30 DIAGNOSIS — I10 Essential (primary) hypertension: Secondary | ICD-10-CM | POA: Diagnosis not present

## 2022-10-30 DIAGNOSIS — K648 Other hemorrhoids: Secondary | ICD-10-CM

## 2022-10-30 DIAGNOSIS — K219 Gastro-esophageal reflux disease without esophagitis: Secondary | ICD-10-CM

## 2022-10-30 DIAGNOSIS — Z8719 Personal history of other diseases of the digestive system: Secondary | ICD-10-CM | POA: Diagnosis not present

## 2022-10-30 DIAGNOSIS — K625 Hemorrhage of anus and rectum: Secondary | ICD-10-CM | POA: Diagnosis not present

## 2022-10-30 DIAGNOSIS — K921 Melena: Secondary | ICD-10-CM | POA: Diagnosis not present

## 2022-10-30 LAB — CBC
HCT: 39.4 % (ref 39.0–52.0)
Hemoglobin: 13.4 g/dL (ref 13.0–17.0)
MCH: 29.8 pg (ref 26.0–34.0)
MCHC: 34 g/dL (ref 30.0–36.0)
MCV: 87.6 fL (ref 80.0–100.0)
Platelets: 212 10*3/uL (ref 150–400)
RBC: 4.5 MIL/uL (ref 4.22–5.81)
RDW: 13.3 % (ref 11.5–15.5)
WBC: 8.8 10*3/uL (ref 4.0–10.5)
nRBC: 0 % (ref 0.0–0.2)

## 2022-10-30 LAB — COMPREHENSIVE METABOLIC PANEL
ALT: 18 U/L (ref 0–44)
ALT: 18 U/L (ref 0–44)
AST: 17 U/L (ref 15–41)
AST: 15 U/L (ref 15–41)
Albumin: 3.1 g/dL — ABNORMAL LOW (ref 3.5–5.0)
Albumin: 3.3 g/dL — ABNORMAL LOW (ref 3.5–5.0)
Alkaline Phosphatase: 47 U/L (ref 38–126)
Alkaline Phosphatase: 47 U/L (ref 38–126)
Anion gap: 9 (ref 5–15)
Anion gap: 9 (ref 5–15)
BUN: 11 mg/dL (ref 8–23)
BUN: 11 mg/dL (ref 8–23)
CO2: 24 mmol/L (ref 22–32)
CO2: 23 mmol/L (ref 22–32)
Calcium: 8.6 mg/dL — ABNORMAL LOW (ref 8.9–10.3)
Calcium: 8.7 mg/dL — ABNORMAL LOW (ref 8.9–10.3)
Chloride: 102 mmol/L (ref 98–111)
Chloride: 107 mmol/L (ref 98–111)
Creatinine, Ser: 0.88 mg/dL (ref 0.61–1.24)
Creatinine, Ser: 0.84 mg/dL (ref 0.61–1.24)
GFR, Estimated: >60 mL/min (ref >60)
GFR, Estimated: >60 mL/min (ref >60)
Glucose, Bld: 98 mg/dL (ref 70–99)
Glucose, Bld: 86 mg/dL (ref 70–99)
Potassium: 3.6 mmol/L (ref 3.5–5.1)
Potassium: 3.4 mmol/L — ABNORMAL LOW (ref 3.5–5.1)
Sodium: 135 mmol/L (ref 135–145)
Sodium: 139 mmol/L (ref 135–145)
Total Bilirubin: 0.8 mg/dL (ref 0.3–1.2)
Total Bilirubin: 0.7 mg/dL (ref 0.3–1.2)
Total Protein: 5.7 g/dL — ABNORMAL LOW (ref 6.5–8.1)
Total Protein: 6 g/dL — ABNORMAL LOW (ref 6.5–8.1)

## 2022-10-30 LAB — TYPE AND SCREEN
ABO/RH(D): A POS
Antibody Screen: NEGATIVE

## 2022-10-30 LAB — HEMOGLOBIN AND HEMATOCRIT, BLOOD
HCT: 38.7 % — ABNORMAL LOW (ref 39.0–52.0)
HCT: 39.8 % (ref 39.0–52.0)
HCT: 40.6 % (ref 39.0–52.0)
Hemoglobin: 12.6 g/dL — ABNORMAL LOW (ref 13.0–17.0)
Hemoglobin: 12.9 g/dL — ABNORMAL LOW (ref 13.0–17.0)
Hemoglobin: 13.3 g/dL (ref 13.0–17.0)

## 2022-10-30 LAB — ABO/RH: ABO/RH(D): A POS

## 2022-10-30 LAB — HIV ANTIBODY (ROUTINE TESTING W REFLEX): HIV Screen 4th Generation wRfx: NONREACTIVE

## 2022-10-30 MED ORDER — HYDROCORTISONE ACETATE 25 MG RE SUPP
25.0000 mg | Freq: Every day | RECTAL | Status: DC
  Administered 2022-10-30 – 2022-10-31 (×2): 25 mg via RECTAL
  Filled 2022-10-30 (×2): qty 1

## 2022-10-30 NOTE — Progress Notes (Signed)
Transition of Care [facility]) - Inpatient Brief Assessment   Patient Details  Name: Johnathan Sullivan MRN: [id number] Date of Birth: Aug 30, 1960  [facility] of Care [facility]) CM/SW Contact:    Janae Bridgeman, RN Phone Number: 10/30/2022, 3:23 PM   Clinical Narrative: Patient admitted to the hospital with rectal bleeding.  GI following.  Patient is currently npo.  No TOC needs at this time but CM will continue to follow.   Transition of Care Asessment: Insurance and Status: (P) Insurance coverage has been reviewed Patient has primary care physician: (P) Yes Home environment has been reviewed: (P) Yes - home with spouse Prior level of function:: (P) Independent Prior/Current Home Services: (P) No current home services Social Determinants of Health Reivew: (P) SDOH reviewed no interventions necessary Readmission risk has been reviewed: (P) Yes Transition of care needs: (P) no transition of care needs at this time

## 2022-10-30 NOTE — Progress Notes (Signed)
[facility]                                                                              Johnathan Sullivan, is a 62 y.o. 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  Patient reported he was using the bathroom in the morning while noticed large volume of bright red blood per rectum.  Patient reported loose stool for last 3 days.  He was also feeling lightheaded.  No CP, dyspnea or syncope.  Endorsed abdominal cramping just prior to event now improving.  Patient reported previous colonoscopy 6 years ago which did not showed any evidence of polyp or hemorrhoid. GI consulted    Assessment & Plan    Principal Problem:   Bright red blood per rectum, lower GI bleed  History of hemorrhoid - Patient reported 3 episodes of loose stool with BRBPR, abd cramping. - On ASA 81mg  daily but no NSAIDs, + history of hemorrhoid without any previous bleeding.  Patient reported previous colonoscopy in the past however unable to find records on the chart.  Per patient no history of polyp and hemorrhoid on colonoscopy. - H/H stable, GI consulted. Rec'd stat CTA abd if brisk bleeding   - continue serial H/H, transfuse if <8,  - cont PPI     Essential hypertension - BP stable, borderline.  - Hold lisinopril    Hyperlipidemia - Resumed Crestor 10 mg daily     Generalized anxiety disorder - cont Wellbutrin XL 150 mg daily.     obesity Estimated body mass index is 36.57 kg/m as calculated from the following:   Height as of this encounter: 5\' 10"  (1.778 m).   Weight as of this  encounter: 115.6 kg.  Code Status: full  DVT Prophylaxis:  SCDs Start: 10/29/22 2235   Level of Care: Level of care: Telemetry Medical Family Communication: Updated patient's wife at bed side  Disposition Plan:      Remains inpatient appropriate:      Procedures:    Consultants:   GI   Antimicrobials: none       Medications  buPROPion  150 mg Oral Daily   pantoprazole (PROTONIX) IV  40 mg Intravenous Q24H   rosuvastatin  10 mg Oral Daily      Subjective:   Johnathan Sullivan was seen and examined today.  1 episode of bleeding this morning. No abd pain, N/V/ fevers, dizziness.     Objective:   Vitals:   10/30/22 0434 10/30/22 0523 10/30/22 0609 10/30/22 0810  BP: 109/73  109/73 111/73  Pulse: (!) 53  (!) 53 (!) 53  Resp:   18 16  Temp: (!) 97.4 F (36.3 C)  (!) 97.4  F (36.3 C) 98.1 F (36.7 C)  TempSrc: Oral  Oral Oral  SpO2: 99%  99% 98%  Weight:  115.6 kg    Height:        Intake/Output Summary (Last 24 hours) at 10/30/2022 1123 Last data filed at 10/30/2022 0336 Gross per 24 hour  Intake 278.16 ml  Output --  Net 278.16 ml     Wt Readings from Last 3 Encounters:  10/30/22 115.6 kg  07/18/21 128.1 kg  07/15/20 122 kg     Exam General: Alert and oriented x 3, NAD Cardiovascular: S1 S2 auscultated,  RRR Respiratory: Clear to auscultation bilaterally Gastrointestinal: Soft, nontender, nondistended, + bowel sounds Ext: no pedal edema bilaterally Neuro: no new FND's  Psych: Normal affect     Data Reviewed:  I have personally reviewed following labs    CBC Lab Results  Component Value Date   WBC 8.8 10/30/2022   RBC 4.50 10/30/2022   HGB 12.6 (L) 10/30/2022   HCT 38.7 (L) 10/30/2022   MCV 87.6 10/30/2022   MCH 29.8 10/30/2022   PLT 212 10/30/2022   MCHC 34.0 10/30/2022   RDW 13.3 10/30/2022   LYMPHSABS 2.1 09/18/2014   MONOABS 0.5 09/18/2014   EOSABS 0.3 09/18/2014   BASOSABS 0.1 09/18/2014     Last metabolic panel Lab Results   Component Value Date   NA 140 10/29/2022   K 3.9 10/29/2022   CL 106 10/29/2022   CO2 25 10/29/2022   BUN 14 10/29/2022   CREATININE 0.83 10/29/2022   GLUCOSE 109 (H) 10/29/2022   GFRNONAA >60 10/29/2022   GFRAA 113 07/27/2019   CALCIUM 9.5 10/29/2022   PROT 6.9 10/29/2022   ALBUMIN 4.5 10/29/2022   BILITOT 0.5 10/29/2022   ALKPHOS 49 10/29/2022   AST 13 (L) 10/29/2022   ALT 15 10/29/2022   ANIONGAP 9 10/29/2022    CBG (last 3)  No results for input(s): "GLUCAP" in the last 72 hours.    Coagulation Profile: No results for input(s): "INR", "PROTIME" in the last 168 hours.   Radiology Studies: I have personally reviewed the imaging studies  No results found.     Johnathan Sullivan M.D. [facility] 10/30/2022, 11:23 AM  Available via Epic secure chat 7am-7pm After 7 pm, please refer to night coverage provider listed on amion.

## 2022-10-30 NOTE — Progress Notes (Signed)
I was instructed by day shift nurse to notify provider of any more bleeding when patient has a bowel movement. During shift change and bedside report, patient stated he had another bloody bowel movement. I assessed the stool in the toilet which appeared bloody. I notified [facility] on call Dr. Imogene Burn and he instructed me to just make a progress note.

## 2022-10-30 NOTE — Plan of Care (Signed)

## 2022-10-30 NOTE — Consult Note (Addendum)
Referring Provider: Dr. Tereasa Coop  Primary Care Physician:  [name], MD Primary Gastroenterologist:  Dr. Lorra Hals  Reason for Consultation:  Hematochezia   HPI: Johnathan Sullivan is a 62 y.o. male with a past medical history of hypertension, hyperlipidemia and GERD, hemorrhoids s/p banding. Past laparoscopic banding surgery. He presented to the ED 10/29/2022 after passing a large volume of  bright red blood per the rectum x 2 to 3 episodes with associated dizziness. Labs in the ED showed a WBC count of 11.4. Hg 14. 2. HCT 42.8. PLT 240. BUN 14. Cr 0.83. Normal LFTs.   He endorsed having nonbloody diarrhea x 2 to 3  episodes on Wed 10/28/2022. He had mild abdominal cramping, no severe abdominal pain. He passed 3 episodes of bright red blood without stool on Thursday 7/18. He takes ASA 81mg  daily, last dose was 3 days ago. No other NSAID use. He passed a moderate volume or bright red blood with a few blood clots 6am today. Hg this morning 13.4. He denies having any abdominal or rectal pain. No CP or SOB.   He was last seen in office by Dr. Kinnie Scales 09/15/2019 and an EGD and colonoscopy were ordered. Patient stated he proceeded with an EGD and colonoscopy by Dr. Kinnie Scales in 2021, however, records not seen in Care Everywhere. A colonoscopy at [facility] 11/02/2019 was documented in Care Everywhere, report not available.   History of GERD for which he takes Pantoprazole 40mg  every day. He denies having any heartburn, dysphagia or upper abdominal pain.   GI PROCEDURES:  EGD 2021: Records not found in Care Everywhere  Colonoscopy 2021: Records not found in Care Everywhere  Colonoscopy 2013: Large internal hemorrhoids, no polyps  Past Medical History:  Diagnosis Date   Allergy    Hyperlipidemia    Labile hypertension    Unspecified vitamin D deficiency     Past Surgical History:  Procedure Laterality Date   HEMORRHOID BANDING  09/2011   LAPAROSCOPIC GASTRIC BANDING  2009     Prior to Admission medications   Medication Sig Start Date End Date Taking? Authorizing Provider  aspirin 81 MG tablet Take 81 mg by mouth daily.    [provider]  buPROPion (WELLBUTRIN XL) 150 MG 24 hr tablet Take 1 tablet by mouth daily. 01/09/19   [provider]  Cholecalciferol (VITAMIN D3) 5000 UNITS CAPS Take 5,000 Units by mouth daily.    [provider]  Glucosamine HCl-MSM 750-750 MG TABS Take by mouth daily.    [provider]  lisinopril (ZESTRIL) 10 MG tablet Take 1 tablet by mouth daily. 10/13/18   [provider]  Multiple Vitamin (MULTIVITAMIN) tablet Take 1 tablet by mouth daily.    [provider]  pantoprazole (PROTONIX) 40 MG tablet Take 1 tablet by mouth daily. 06/23/16   [provider]  rosuvastatin (CRESTOR) 10 MG tablet Take 10 mg by mouth daily.    [provider]    Current Facility-Administered Medications  Medication Dose Route Frequency Provider Last Rate Last Admin   acetaminophen (TYLENOL) tablet 650 mg  650 mg Oral Q6H PRN Janalyn Shy, Subrina, MD       Or   acetaminophen (TYLENOL) suppository 650 mg  650 mg Rectal Q6H PRN Janalyn Shy, Subrina, MD       buPROPion (WELLBUTRIN XL) 24 hr tablet 150 mg  150 mg Oral Daily Sundil, Subrina, MD       hydrALAZINE (APRESOLINE) injection 10 mg  10  mg Intravenous Q8H PRN Janalyn Shy, Subrina, MD       lactated ringers infusion   Intravenous Continuous Sundil, Subrina, MD 75 mL/hr at 10/30/22 0336 Infusion Verify at 10/30/22 0336   ondansetron (ZOFRAN) tablet 4 mg  4 mg Oral Q6H PRN Tereasa Coop, MD       Or   ondansetron [facility]) injection 4 mg  4 mg Intravenous Q6H PRN Janalyn Shy, Subrina, MD       pantoprazole (PROTONIX) injection 40 mg  40 mg Intravenous Q24H Sundil, Subrina, MD   40 mg at 10/29/22 2345   rosuvastatin (CRESTOR) tablet 10 mg  10 mg Oral Daily Tereasa Coop, MD        Allergies as of 10/29/2022   (No Known Allergies)    Family History   Problem Relation Age of Onset   Cancer Mother        Breast   Hypertension Mother    Hypertension Father    Heart attack Father    Arthritis Father    CAD Brother    CAD Paternal Grandfather     Social History   Socioeconomic History   Marital status: Married    Spouse name: Not on file   Number of children: Not on file   Years of education: Not on file   Highest education level: Not on file  Occupational History   Not on file  Tobacco Use   Smoking status: Former    Current packs/day: 0.00    Average packs/day: 2.0 packs/day for 15.0 years (30.0 ttl pk-yrs)    Types: Cigarettes    Start date: 05/18/1978    Quit date: 05/18/1993    Years since quitting: 29.4   Smokeless tobacco: Never  Substance and Sexual Activity   Alcohol use: No   Drug use: No   Sexual activity: Not on file  Other Topics Concern   Not on file  Social History Narrative   Not on file   Social Determinants of Health   Financial Resource Strain: Low Risk  (09/24/2022)   Received from [facility]   Overall Financial Resource Strain (CARDIA)    Difficulty of Paying Living Expenses: Not hard at all  Food Insecurity: No Food Insecurity (10/29/2022)   Hunger Vital Sign    Worried About Running Out of Food in the Last Year: Never true    Ran Out of Food in the Last Year: Never true  Transportation Needs: No Transportation Needs (10/29/2022)   PRAPARE - Administrator, Civil Service (Medical): No    Lack of Transportation (Non-Medical): No  Physical Activity: Inactive (09/24/2022)   Received from [facility]   Exercise Vital Sign    Days of Exercise per Week: 0 days    Minutes of Exercise per Session: 30 min  Stress: No Stress Concern Present (09/24/2022)   Received from [facility] Occup[facility] - Occupational Stress Questionnaire    Feeling of Stress : Not at all  Social Connections: Moderately Integrated (09/24/2022)   Received from [facility]    Social Network    How would you rate your social network (family, work, friends)?: Adequate participation with social networks  Intimate Partner Violence: Not At Risk (10/29/2022)   Humiliation, Afraid, Rape, and Kick questionnaire    Fear of Current or Ex-Partner: No    Emotionally Abused: No    Physically Abused: No    Sexually Abused: No   Review of Systems: Gen: Denies fever, sweats  or chills. No weight loss.  CV: Denies chest pain, palpitations or edema. Resp: Denies cough, shortness of breath of hemoptysis.  GI: See HPI.   GU : Denies urinary burning, blood in urine, increased urinary frequency or incontinence. MS: Denies joint pain, muscles aches or weakness. Derm: Denies rash, itchiness, skin lesions or unhealing ulcers. Psych: Denies depression, anxiety, memory loss or confusion. Heme: Denies easy bruising, bleeding. Neuro:  Denies headaches, dizziness or paresthesias. Endo:  Denies any problems with DM, thyroid or adrenal function.  Physical Exam: Vital signs in last 24 hours: Temp:  [97.2 F (36.2 C)-98.6 F (37 C)] 98 F (36.7 C) (07/19 0005) Pulse Rate:  [55-72] 57 (07/19 0005) Resp:  [16-20] 16 (07/18 2000) BP: (113-140)/(71-95) 119/71 (07/19 0005) SpO2:  [96 %-100 %] 96 % (07/19 0005) Weight:  [113.4 kg-115.6 kg] 115.6 kg (07/19 0523) Last BM Date : 10/29/22 General: Alert 62 year old male in NAD. Head:  Normocephalic and atraumatic. Eyes:  No scleral icterus. Conjunctiva pink. Ears:  Normal auditory acuity. Nose:  No deformity, discharge or lesions. Mouth:  Dentition intact. No ulcers or lesions.  Neck:  Supple. No lymphadenopathy or thyromegaly.  Lungs: Breath sounds clear throughout. No wheezes, rhonchi or crackles.  Heart:  RRR, no murmur.  Abdomen:  Soft, nondistended. Nontender. Positive bowel sounds x 4 quads. Rectal: Edematous anal hemorrhoids, internal hemorrhoids palpated without active bleeding at this time. No stool in rectal vault. RN at the  bedside at time of exam.  Musculoskeletal:  Symmetrical without gross deformities.  Pulses:  Normal pulses noted. Extremities:  Without clubbing or edema. Neurologic:  Alert and  oriented x 4. No focal deficits.  Skin:  Intact without significant lesions or rashes. Psych:  Alert and cooperative. Normal mood and affect.  Intake/Output from previous day: 07/18 0701 - 07/19 0700 In: 278.2 [I.V.:278.2] Out: -  Intake/Output this shift: Total I/O In: 278.2 [I.V.:278.2] Out: -   Lab Results: Recent Labs    10/29/22 1351 10/30/22 0011  WBC 11.4* 8.8  HGB 14.2 13.4  HCT 42.8 39.4  PLT 240 212   BMET Recent Labs    10/29/22 1351  NA 140  K 3.9  CL 106  CO2 25  GLUCOSE 109*  BUN 14  CREATININE 0.83  CALCIUM 9.5   LFT Recent Labs    10/29/22 1351  PROT 6.9  ALBUMIN 4.5  AST 13*  ALT 15  ALKPHOS 49  BILITOT 0.5     IMPRESSION/PLAN:  62 year old male admitted with hematochezia. He had nonbloody diarrhea with lower abdominal cramping one day prior to onset of hematochezia. Hg 14.2 -> 13.4. Passed moderate volume hematochezia x 1 at 6am today. -NPO with ice chips for now -Stat CTA if brisk bleeding occurs  -Diagnostic colonoscopy deferred for now -Request  colonoscopy records 2021 from [facility]  -Anusol Hc 25mg  suppository one PR Q HS x 5 nights -Check H/H Q 6 hrs x 24 hours -Transfuse for Hg level < 8 or as needed if symptomatic    GERD -Pantoprazole 40mg  po QD  Johnathan Sullivan  10/30/2022, 9:24 AM     [facility] Attending   I have taken an interval history, reviewed the chart and examined the patient. I agree with the Advanced Practitioner's note, impression and recommendations.    Also - anoscopy performed by me shows inflamed edematous internal hemorrhoids all positions - no blood  So I think he had diarrhea that aggravated hemorrhoids and caused bleeding.  If he stops bleeding will send home and schedule outpatient  colonoscopy.   Iva Boop, MD, [facility] See Loretha Stapler on call - gastroenterology for best contact person 10/30/2022 5:09 PM

## 2022-10-31 DIAGNOSIS — K648 Other hemorrhoids: Secondary | ICD-10-CM | POA: Diagnosis not present

## 2022-10-31 DIAGNOSIS — K625 Hemorrhage of anus and rectum: Secondary | ICD-10-CM | POA: Diagnosis not present

## 2022-10-31 DIAGNOSIS — Z8719 Personal history of other diseases of the digestive system: Secondary | ICD-10-CM | POA: Diagnosis not present

## 2022-10-31 DIAGNOSIS — K641 Second degree hemorrhoids: Secondary | ICD-10-CM

## 2022-10-31 DIAGNOSIS — I10 Essential (primary) hypertension: Secondary | ICD-10-CM | POA: Diagnosis not present

## 2022-10-31 DIAGNOSIS — K921 Melena: Secondary | ICD-10-CM | POA: Diagnosis not present

## 2022-10-31 LAB — BASIC METABOLIC PANEL
Anion gap: 9 (ref 5–15)
BUN: 9 mg/dL (ref 8–23)
CO2: 23 mmol/L (ref 22–32)
Calcium: 8.5 mg/dL — ABNORMAL LOW (ref 8.9–10.3)
Chloride: 106 mmol/L (ref 98–111)
Creatinine, Ser: 0.82 mg/dL (ref 0.61–1.24)
GFR, Estimated: >60 mL/min (ref >60)
Glucose, Bld: 89 mg/dL (ref 70–99)
Potassium: 3.3 mmol/L — ABNORMAL LOW (ref 3.5–5.1)
Sodium: 138 mmol/L (ref 135–145)

## 2022-10-31 LAB — HEMOGLOBIN AND HEMATOCRIT, BLOOD
HCT: 39.9 % (ref 39.0–52.0)
Hemoglobin: 13 g/dL (ref 13.0–17.0)

## 2022-10-31 MED ORDER — PANTOPRAZOLE SODIUM 40 MG PO TBEC
40.0000 mg | DELAYED_RELEASE_TABLET | Freq: Every day | ORAL | Status: DC
  Administered 2022-10-31 – 2022-11-01 (×2): 40 mg via ORAL
  Filled 2022-10-31 (×2): qty 1

## 2022-10-31 NOTE — Plan of Care (Signed)

## 2022-10-31 NOTE — Progress Notes (Addendum)
[facility] Progress Note  CC:  Hematochezia   Subjective: He passed a small amount of red blood per the rectum around 8 PM yesterday evening without recurrence.  He remains on a clear liquid diet.  No abdominal pain.  No chest pain or shortness of breath.  Wife at the bedside.   Objective:  Vital signs in last 24 hours: Temp:  [97.6 F (36.4 C)-99.2 F (37.3 C)] 98.3 F (36.8 C) (07/20 0732) Pulse Rate:  [54-61] 56 (07/20 0732) Resp:  [16-18] 18 (07/20 0732) BP: (95-127)/(61-75) 120/69 (07/20 0732) SpO2:  [97 %-100 %] 98 % (07/20 0732) Weight:  [115.8 kg] 115.8 kg (07/20 0600) Last BM Date : 10/30/22 General: 62 year old male in no acute distress. Heart: Rate and rhythm, no murmurs. Pulm: Breath sounds clear throughout. Abdomen: Soft, nondistended.  Nontender.  Positive bowel sounds all 4 quadrants. Extremities:  Without edema. Neurologic:  Alert and  oriented x 4. Grossly normal neurologically. Psych:  Alert and cooperative. Normal mood and affect.  Intake/Output from previous day: No intake/output data recorded. Intake/Output this shift: Total I/O In: 60 [P.O.:60] Out: -   Lab Results: Recent Labs    10/29/22 1351 10/30/22 0011 10/30/22 0841 10/30/22 1300 10/30/22 1830 10/31/22 0555  WBC 11.4* 8.8  --   --   --   --   HGB 14.2 13.4   < > 12.9* 13.3 13.0  HCT 42.8 39.4   < > 39.8 40.6 39.9  PLT 240 212  --   --   --   --    < > = values in this interval not displayed.   BMET Recent Labs    10/30/22 0011 10/30/22 0841 10/31/22 0555  NA 139 135 138  K 3.4* 3.6 3.3*  CL 107 102 106  CO2 23 24 23   GLUCOSE 86 98 89  BUN 11 11 9   CREATININE 0.84 0.88 0.82  CALCIUM 8.7* 8.6* 8.5*   LFT Recent Labs    10/30/22 0841  PROT 5.7*  ALBUMIN 3.1*  AST 17  ALT 18  ALKPHOS 47  BILITOT 0.8   PT/INR No results for input(s): "LABPROT", "INR" in the last 72 hours. Hepatitis Panel No results for input(s): "HEPBSAG", "HCVAB", "HEPAIGM",  "HEPBIGM" in the last 72 hours.  No results found.  Assessment / Plan:  62 year old male admitted with hematochezia. He had nonbloody diarrhea with lower abdominal cramping one day prior to onset of hematochezia. Hg 14.2 -> 13.4 -> today Hg 13.  Rectal exam and anoscopy exam showed nonbleeding inflamed internal hemorrhoids.  He passed a small volume of bright red blood per the rectum around 8 PM yesterday evening without recurrence.  Hemodynamically stable. -Advance diet as tolerated -Stat CTA if brisk bleeding occurs  -Transfuse for Hg level < 8 or as needed if symptomatic   -Colonoscopy as an outpatient, patient wishes to establish GI management with [facility] gastroenterology.  Our office will contact the patient to schedule a follow up appointment -Request  colonoscopy records 2021 from [facility]  -Anusol Hc 25mg  suppository one PR Q HS x 5 nights -Defer discharge recommendations to the hospitalist    GERD -Pantoprazole 40mg  po every day  Principal Problem:   Bright red blood per rectum Active Problems:   Hyperlipidemia   History of hemorrhoids   Essential hypertension   GAD (generalized anxiety disorder)   Hemorrhoids, internal, with bleeding     LOS: 0 days   Colleen M Kennedy-Smith  10/31/2022, 10:12 AM   Attending physician's note   I have taken a history, reviewed the chart and examined the patient. I performed a substantive portion of this encounter, including complete performance of at least one of the key components, in conjunction with the APP. I agree with the APP's note, impression and recommendations.    No further bleeding.  Continue hydrocortisone suppository per rectum at bedtime Follow-up in GI office for outpatient colonoscopy and will consider hemorrhoidal band ligation after colonoscopy Okay to discharge patient home from GI standpoint  The patient was provided an opportunity to ask questions and all were answered. The patient agreed with the plan and  demonstrated an understanding of the instructions.   Iona Beard , MD [phone]

## 2022-10-31 NOTE — Progress Notes (Addendum)
[facility]                                                                              Johnathan Sullivan, is a 62 y.o. 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  Patient reported he was using the bathroom in the morning while noticed large volume of bright red blood per rectum.  Patient reported loose stool for last 3 days.  He was also feeling lightheaded.  No CP, dyspnea or syncope.  Endorsed abdominal cramping just prior to event now improving.  Patient reported previous colonoscopy 6 years ago which did not showed any evidence of polyp or hemorrhoid. GI consulted    Assessment & Plan    Principal Problem:   Bright red blood per rectum, lower GI bleed  History of hemorrhoid - Patient reported 3 episodes of loose stool with BRBPR, abd cramping. - On ASA 81mg  daily but no NSAIDs, + history of hemorrhoid without any previous bleeding.  Patient reported previous colonoscopy in the past however unable to find records on the chart.  Per patient no history of polyp and hemorrhoid on colonoscopy. -  GI consulted. Rec'd stat CTA abd if brisk bleeding   -States overnight had bleeding, this morning appeared to be clear with no blood -H&H stable -Anusol suppository for hemorrhoids -Diet advanced to soft diet.  If no further bleeding today and H&H stable tomorrow, will DC home    Essential hypertension - BP stable, borderline.  - Hold lisinopril    Hyperlipidemia - Resumed Crestor 10 mg daily     Generalized anxiety disorder - cont Wellbutrin XL 150 mg  daily.     obesity Estimated body mass index is 36.63 kg/m as calculated from the following:   Height as of this encounter: 5\' 10"  (1.778 m).   Weight as of this encounter: 115.8 kg.  Code Status: full  DVT Prophylaxis:  SCDs Start: 10/29/22 2235   Level of Care: Level of care: Telemetry Medical Family Communication: Updated patient's wife at bed side  Disposition Plan:      Remains inpatient appropriate:   If no further bleeding, DC home in a.m.   Procedures:    Consultants:   GI   Antimicrobials: none   Medications  buPROPion  150 mg Oral Daily   hydrocortisone  25 mg Rectal QHS   pantoprazole  40 mg Oral Daily   rosuvastatin  10 mg Oral Daily      Subjective:   Johnathan Sullivan was seen and examined today.  Per patient, 1 episode of bleeding yesterday evening.  BM this morning was clear. No abd pain, N/V/ fevers, dizziness.     Objective:   Vitals:  10/31/22 0031 10/31/22 0443 10/31/22 0600 10/31/22 0732  BP: 109/70 95/65  120/69  Pulse: (!) 58 (!) 54  (!) 56  Resp: 18 16  18   Temp: 98.6 F (37 C) 97.7 F (36.5 C)  98.3 F (36.8 C)  TempSrc: Oral     SpO2: 100% 98%  98%  Weight:   115.8 kg   Height:        Intake/Output Summary (Last 24 hours) at 10/31/2022 1323 Last data filed at 10/31/2022 1220 Gross per 24 hour  Intake 80 ml  Output --  Net 80 ml     Wt Readings from Last 3 Encounters:  10/31/22 115.8 kg  07/18/21 128.1 kg  07/15/20 122 kg    Physical Exam General: Alert and oriented x 3, NAD Cardiovascular: S1 S2 clear, RRR.  Respiratory: CTAB Gastrointestinal: Soft, nontender, nondistended, NBS Ext: no pedal edema bilaterally Neuro: no new deficits Psych: Normal affect     Data Reviewed:  I have personally reviewed following labs    CBC Lab Results  Component Value Date   WBC 8.8 10/30/2022   RBC 4.50 10/30/2022   HGB 13.0 10/31/2022   HCT 39.9 10/31/2022   MCV 87.6 10/30/2022   MCH 29.8 10/30/2022   PLT 212 10/30/2022    MCHC 34.0 10/30/2022   RDW 13.3 10/30/2022   LYMPHSABS 2.1 09/18/2014   MONOABS 0.5 09/18/2014   EOSABS 0.3 09/18/2014   BASOSABS 0.1 09/18/2014     Last metabolic panel Lab Results  Component Value Date   NA 138 10/31/2022   K 3.3 (L) 10/31/2022   CL 106 10/31/2022   CO2 23 10/31/2022   BUN 9 10/31/2022   CREATININE 0.82 10/31/2022   GLUCOSE 89 10/31/2022   GFRNONAA >60 10/31/2022   GFRAA 113 07/27/2019   CALCIUM 8.5 (L) 10/31/2022   PROT 5.7 (L) 10/30/2022   ALBUMIN 3.1 (L) 10/30/2022   BILITOT 0.8 10/30/2022   ALKPHOS 47 10/30/2022   AST 17 10/30/2022   ALT 18 10/30/2022   ANIONGAP 9 10/31/2022    CBG (last 3)  No results for input(s): "GLUCAP" in the last 72 hours.    Coagulation Profile: No results for input(s): "INR", "PROTIME" in the last 168 hours.   Radiology Studies: I have personally reviewed the imaging studies  No results found.     Thad Ranger M.D. [facility] 10/31/2022, 1:23 PM  Available via Epic secure chat 7am-7pm After 7 pm, please refer to night coverage provider listed on amion.

## 2022-11-01 ENCOUNTER — Telehealth: Payer: Self-pay | Admitting: Nurse Practitioner

## 2022-11-01 DIAGNOSIS — I10 Essential (primary) hypertension: Secondary | ICD-10-CM | POA: Diagnosis not present

## 2022-11-01 DIAGNOSIS — E7800 Pure hypercholesterolemia, unspecified: Secondary | ICD-10-CM | POA: Diagnosis not present

## 2022-11-01 DIAGNOSIS — K625 Hemorrhage of anus and rectum: Secondary | ICD-10-CM | POA: Diagnosis not present

## 2022-11-01 DIAGNOSIS — Z8719 Personal history of other diseases of the digestive system: Secondary | ICD-10-CM | POA: Diagnosis not present

## 2022-11-01 LAB — BASIC METABOLIC PANEL
Anion gap: 6 (ref 5–15)
BUN: 10 mg/dL (ref 8–23)
CO2: 24 mmol/L (ref 22–32)
Calcium: 8.4 mg/dL — ABNORMAL LOW (ref 8.9–10.3)
Chloride: 107 mmol/L (ref 98–111)
Creatinine, Ser: 0.95 mg/dL (ref 0.61–1.24)
GFR, Estimated: >60 mL/min (ref >60)
Glucose, Bld: 131 mg/dL — ABNORMAL HIGH (ref 70–99)
Potassium: 3.3 mmol/L — ABNORMAL LOW (ref 3.5–5.1)
Sodium: 137 mmol/L (ref 135–145)

## 2022-11-01 LAB — CBC
HCT: 38.7 % — ABNORMAL LOW (ref 39.0–52.0)
Hemoglobin: 12.8 g/dL — ABNORMAL LOW (ref 13.0–17.0)
MCH: 28.4 pg (ref 26.0–34.0)
MCHC: 33.1 g/dL (ref 30.0–36.0)
MCV: 86 fL (ref 80.0–100.0)
Platelets: 225 10*3/uL (ref 150–400)
RBC: 4.5 MIL/uL (ref 4.22–5.81)
RDW: 13.2 % (ref 11.5–15.5)
WBC: 9.1 10*3/uL (ref 4.0–10.5)
nRBC: 0 % (ref 0.0–0.2)

## 2022-11-01 MED ORDER — HYDROCORTISONE ACETATE 25 MG RE SUPP: 25.0000 mg | Freq: Every day | RECTAL | 0 refills | Status: AC

## 2022-11-01 MED ORDER — POTASSIUM CHLORIDE CRYS ER 20 MEQ PO TBCR
40.0000 meq | EXTENDED_RELEASE_TABLET | Freq: Once | ORAL | Status: AC
  Administered 2022-11-01: 40 meq via ORAL
  Filled 2022-11-01: qty 2

## 2022-11-01 MED ORDER — ASPIRIN 81 MG PO TABS: 81.0000 mg | ORAL_TABLET | Freq: Every day | ORAL | Status: AC

## 2022-11-01 MED ORDER — ONDANSETRON HCL 4 MG PO TABS: 4.0000 mg | ORAL_TABLET | Freq: Three times a day (TID) | ORAL | 0 refills | Status: DC | PRN

## 2022-11-01 MED ORDER — PANTOPRAZOLE SODIUM 40 MG PO TBEC: 40.0000 mg | DELAYED_RELEASE_TABLET | Freq: Every morning | ORAL | 3 refills | Status: AC

## 2022-11-01 NOTE — Telephone Encounter (Signed)
Johnathan Sullivan, pls contact patient and schedule him for a follow up office visit with me or other APP in 2 to 3 weeks, he will need outpatient colonoscopy. Please send him to our lab for a  repeat CBC and BMP in 1 week. THX.

## 2022-11-01 NOTE — Discharge Summary (Signed)
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  Patient reported he was using the bathroom in the morning while noticed large volume of bright red blood per rectum.  Patient reported loose stool for last 3 days.  He was also feeling lightheaded.  No CP, dyspnea or syncope.  Endorsed abdominal cramping just prior to event now improving.  Patient reported previous colonoscopy 6 years ago which did not showed any evidence of polyp or hemorrhoid. GI consulted    Assessment and Plan:  Bright red blood per rectum, lower GI bleed  History of hemorrhoid - Patient reported 3 episodes of loose stool with BRBPR, abd cramping. - On ASA 81mg  daily but no NSAIDs, + history of hemorrhoid without any previous bleeding.  Patient reported previous colonoscopy in the past however unable to find records on the chart.  Per patient no history of polyp and hemorrhoid on colonoscopy. - GI was consulted -Rectal exam and anoscopy exam per GI showed nonbleeding inflamed internal hemorrhoids -Anusol suppository for hemorrhoids -H&H remained stable, no further bleeding, patient will follow-up with GI outpatient.     Essential hypertension - Continue lisinopril   Hyperlipidemia - Resumed  Crestor 10 mg daily     Generalized anxiety disorder - cont Wellbutrin XL 150 mg daily.     obesity Estimated body mass index is 36.63 kg/m as calculated from the following:   Height as of this encounter: 5\' 10"  (1.778 m).   Weight as of this encounter: 115.8 kg.     Pain control - [employer] Controlled Substance Reporting System database was reviewed. and patient was instructed, not to drive, operate heavy machinery, perform activities at heights, swimming or participation in water activities or provide baby-sitting services while on Pain, Sleep and Anxiety Medications; until their outpatient Physician has advised to do so again. Also recommended to not to take more than prescribed Pain, Sleep and Anxiety Medications.  Consultants: GI Procedures performed: None Disposition: Home Diet recommendation:  Discharge Diet Orders (From admission, onward)     Start     Ordered   11/01/22 0000  Diet - low sodium heart healthy        11/01/22 0818           DISCHARGE MEDICATION: Allergies as of 11/01/2022   No Known Allergies      Medication List     TAKE these medications    albuterol 108 (90 Base) MCG/ACT inhaler Commonly known as: VENTOLIN HFA Inhale 2 puffs into the lungs every 4 (four) hours as needed for shortness of breath.   aspirin 81 MG tablet Take 1 tablet (81 mg total) by mouth daily. HOLD x 1 week What changed: additional instructions   buPROPion 150 MG 24 hr tablet Commonly known as: WELLBUTRIN XL  Take 1 tablet by mouth daily.   Glucosamine HCl-MSM 750-750 MG Tabs Take by mouth daily.   hydrocortisone 25 MG suppository Commonly known as: ANUSOL-HC Place 1 suppository (25 mg total) rectally at bedtime.   lisinopril 10 MG tablet Commonly known as: ZESTRIL Take 1 tablet by mouth daily.   multivitamin tablet Take 1 tablet by mouth daily.   ondansetron 4 MG tablet Commonly known as: ZOFRAN Take 1 tablet (4 mg total) by mouth every 8 (eight)  hours as needed for nausea or vomiting.   pantoprazole 40 MG tablet Commonly known as: PROTONIX Take 1 tablet (40 mg total) by mouth in the morning. What changed: when to take this   QUEtiapine 100 MG tablet Commonly known as: SEROQUEL Take 150 mg by mouth at bedtime.   rosuvastatin 10 MG tablet Commonly known as: CRESTOR Take 10 mg by mouth daily.   Vitamin D3 125 MCG (5000 UT) Caps Take 5,000 Units by mouth daily.        Follow-up Information     [name], MD. Schedule an appointment as soon as possible for a visit in 2 week(s).   Specialty: Family Medicine Why: for hospital follow-up Contact information: [address] [place] [address] [phone]         Napoleon Form, MD. Schedule an appointment as soon as possible for a visit in 3 week(s).   Specialty: Gastroenterology Why: for hospital follow-up Contact information: [address] [place] [address] [phone]                Discharge Exam: Ceasar Mons Weights   10/30/22 0523 10/31/22 0600 11/01/22 0409  Weight: 115.6 kg 115.8 kg 119.7 kg   S: No further bleeding, tolerating diet, wants to go home.  Wife at the bedside.  BP 107/60 (BP Location: Left Arm)   Pulse (!) 59   Temp 98 F (36.7 C)   Resp 18   Ht 5\' 10"  (1.778 m)   Wt 119.7 kg   SpO2 98%   BMI 37.87 kg/m    Physical Exam General: Alert and oriented x 3, NAD Cardiovascular: S1 S2 clear, RRR.  Respiratory: CTAB, no wheezing, rales or rhonchi Gastrointestinal: Soft, nontender, nondistended, NBS Ext: no pedal edema bilaterally Neuro: no new deficits Psych: Normal affect    Condition at discharge: fair  The results of significant diagnostics from this hospitalization (including imaging, microbiology, ancillary and laboratory) are listed below for reference.   Imaging Studies: No results found.  Microbiology: Results for orders placed or performed during the hospital encounter of  07/07/19  SARS CORONAVIRUS 2 (TAT 6-24 HRS) Nasopharyngeal Nasopharyngeal Swab     Status: None   Collection Time: 07/07/19 11:51 AM   Specimen: Nasopharyngeal Swab  Result Value Ref Range Status   SARS Coronavirus 2 NEGATIVE NEGATIVE Final    Comment: (NOTE) SARS-CoV-2 target nucleic acids are NOT DETECTED. The SARS-CoV-2 RNA is generally detectable in upper and lower respiratory specimens during the acute phase of infection. Negative results do not preclude SARS-CoV-2 infection, do not rule out co-infections with other pathogens, and should not be used as the sole basis for treatment or other patient management decisions. Negative results must be combined with clinical observations, patient history, and epidemiological information. The expected result is Negative. Fact Sheet for Patients: HairSlick.no Fact Sheet for Healthcare Providers: quierodirigir.com This test is not yet approved or cleared by the [place] FDA and  has been authorized for detection and/or  diagnosis of SARS-CoV-2 by FDA under an Emergency Use Authorization (EUA). This EUA will remain  in effect (meaning this test can be used) for the duration of the COVID-19 declaration under Section 56 4(b)(1) of the Act, 21 U.S.C. section 360bbb-3(b)(1), unless the authorization is terminated or revoked sooner. Performed at [facility], [address], [place], [place] [address]     Labs: CBC: Recent Labs  Lab 10/29/22 1351 10/30/22 0011 10/30/22 0841 10/30/22 1300 10/30/22 1830 10/31/22 0555 11/01/22 0424  WBC 11.4* 8.8  --   --   --   --  9.1  HGB 14.2 13.4 12.6* 12.9* 13.3 13.0 12.8*  HCT 42.8 39.4 38.7* 39.8 40.6 39.9 38.7*  MCV 87.3 87.6  --   --   --   --  86.0  PLT 240 212  --   --   --   --  225   Basic Metabolic Panel: Recent Labs  Lab 10/29/22 1351 10/30/22 0011 10/30/22 0841 10/31/22 0555 11/01/22 0424  NA 140 139 135 138 137   K 3.9 3.4* 3.6 3.3* 3.3*  CL 106 107 102 106 107  CO2 25 23 24 23 24   GLUCOSE 109* 86 98 89 131*  BUN 14 11 11 9 10   CREATININE 0.83 0.84 0.88 0.82 0.95  CALCIUM 9.5 8.7* 8.6* 8.5* 8.4*   Liver Function Tests: Recent Labs  Lab 10/29/22 1351 10/30/22 0011 10/30/22 0841  AST 13* 15 17  ALT 15 18 18   ALKPHOS 49 47 47  BILITOT 0.5 0.7 0.8  PROT 6.9 6.0* 5.7*  ALBUMIN 4.5 3.3* 3.1*   CBG: No results for input(s): "GLUCAP" in the last 168 hours.  Discharge time spent: greater than 30 minutes.  Signed: Thad Ranger, MD [facility] 11/01/2022

## 2022-11-02 ENCOUNTER — Other Ambulatory Visit: Payer: Self-pay

## 2022-11-02 ENCOUNTER — Telehealth: Payer: Self-pay

## 2022-11-02 DIAGNOSIS — K922 Gastrointestinal hemorrhage, unspecified: Secondary | ICD-10-CM

## 2022-11-02 NOTE — Telephone Encounter (Signed)
Pt and pt wife Claris Che was made aware of Alcide Evener NP recommendations:   Orders for labs placed in Epic to be drawn in 1 week. Pt was made aware. Location for lab provided.    Pt and Claris Che was made aware that I will contact them in 1 week to schedule a follow up  office visit. (Only appointments available have 7 day holds)   Pt stated that this Morning that he woke up with a fever of 101 and is feeling tired. Pt stated that he had no other symptoms. No GI complaints, No GI bleeding. Pt was notified to reach out to his PCP for evaluation and assessment of the temp of 101.   Pt verbalized understanding with all questions answered.

## 2022-11-02 NOTE — Telephone Encounter (Signed)
Pt and pt wife Johnathan Sullivan was made aware of Johnathan Evener NP recommendations:   Orders for labs placed in Epic to be drawn in 1 week. Pt was made aware. Location for lab provided.    Pt and Johnathan Sullivan was made aware that I will contact them in 1 week to schedule a follow up  office visit. (Only appointments available have 7 day holds)   Pt stated that this Morning that he woke up with a fever of 101 and is feeling tired. Pt stated that he had no other symptoms. No GI complaints, No GI bleeding. Pt was notified to reach out to his PCP for evaluation and assessment of the temp of 101.   Pt verbalized understanding with all questions answered.

## 2022-11-04 DIAGNOSIS — D485 Neoplasm of uncertain behavior of skin: Secondary | ICD-10-CM | POA: Diagnosis not present

## 2022-11-04 DIAGNOSIS — Z1283 Encounter for screening for malignant neoplasm of skin: Secondary | ICD-10-CM | POA: Diagnosis not present

## 2022-11-04 DIAGNOSIS — D225 Melanocytic nevi of trunk: Secondary | ICD-10-CM | POA: Diagnosis not present

## 2022-11-04 NOTE — Telephone Encounter (Signed)
Inbound call from patient requesting a call back regarding scheduling colonoscopy. Please advise, thank you.

## 2022-11-05 NOTE — Telephone Encounter (Signed)
Spoke with patient & advised him that he does need OV and labs prior to scheduling colon. As of right now there are still no appointments 2-3 weeks out, other than 7 day holds. He's aware that Viviann Spare, RN will reach out to him on Monday for scheduling OV. Pt verbalized all understanding.

## 2022-11-09 ENCOUNTER — Telehealth: Payer: Self-pay

## 2022-11-09 NOTE — Telephone Encounter (Signed)
Pt was made aware of recent results and Alcide Evener NP recommendations: Pt was reminded of labs that need to be collected  today and was scheduled for an office visit to see Alcide Evener NP on 11/16/2022 at 1:30. Pt made aware. Pt verbalized understanding with all questions answered.

## 2022-11-10 ENCOUNTER — Other Ambulatory Visit (INDEPENDENT_AMBULATORY_CARE_PROVIDER_SITE_OTHER): Payer: BC Managed Care – PPO

## 2022-11-10 DIAGNOSIS — K922 Gastrointestinal hemorrhage, unspecified: Secondary | ICD-10-CM | POA: Diagnosis not present

## 2022-11-10 LAB — CBC WITH DIFFERENTIAL/PLATELET
Basophils Absolute: 0.1 10*3/uL (ref 0.0–0.1)
Basophils Relative: 1 % (ref 0.0–3.0)
Eosinophils Absolute: 0.4 10*3/uL (ref 0.0–0.7)
Eosinophils Relative: 3 % (ref 0.0–5.0)
HCT: 42.2 % (ref 39.0–52.0)
Hemoglobin: 13.6 g/dL (ref 13.0–17.0)
Lymphocytes Relative: 34.1 % (ref 12.0–46.0)
Lymphs Abs: 4.7 10*3/uL — ABNORMAL HIGH (ref 0.7–4.0)
MCHC: 32.3 g/dL (ref 30.0–36.0)
MCV: 86.7 fl (ref 78.0–100.0)
Monocytes Absolute: 0.6 10*3/uL (ref 0.1–1.0)
Monocytes Relative: 4.7 % (ref 3.0–12.0)
Neutro Abs: 7.8 10*3/uL — ABNORMAL HIGH (ref 1.4–7.7)
Neutrophils Relative %: 57.2 % (ref 43.0–77.0)
Platelets: 295 10*3/uL (ref 150.0–400.0)
RBC: 4.86 Mil/uL (ref 4.22–5.81)
RDW: 14.1 % (ref 11.5–15.5)
WBC: 13.7 10*3/uL — ABNORMAL HIGH (ref 4.0–10.5)

## 2022-11-10 LAB — BASIC METABOLIC PANEL
BUN: 11 mg/dL (ref 6–23)
CO2: 26 mEq/L (ref 19–32)
Calcium: 9.7 mg/dL (ref 8.4–10.5)
Chloride: 103 mEq/L (ref 96–112)
Creatinine, Ser: 0.98 mg/dL (ref 0.40–1.50)
GFR: 83.02 mL/min (ref >60.00)
Glucose, Bld: 106 mg/dL — ABNORMAL HIGH (ref 70–99)
Potassium: 3.9 mEq/L (ref 3.5–5.1)
Sodium: 139 mEq/L (ref 135–145)

## 2022-11-16 ENCOUNTER — Ambulatory Visit (INDEPENDENT_AMBULATORY_CARE_PROVIDER_SITE_OTHER): Payer: BC Managed Care – PPO | Admitting: Nurse Practitioner

## 2022-11-16 ENCOUNTER — Encounter: Payer: Self-pay | Admitting: Nurse Practitioner

## 2022-11-16 VITALS — BP 112/60 | HR 76 | Ht 70.0 in | Wt 258.5 lb

## 2022-11-16 DIAGNOSIS — K625 Hemorrhage of anus and rectum: Secondary | ICD-10-CM | POA: Diagnosis not present

## 2022-11-16 MED ORDER — NA SULFATE-K SULFATE-MG SULF 17.5-3.13-1.6 GM/177ML PO SOLN: 1.0000 | Freq: Once | ORAL | 0 refills | Status: AC

## 2022-11-16 NOTE — Progress Notes (Addendum)
11/16/2022 Johnathan Sullivan [id number] [birth year]   CHIEF COMPLAINT: Hospital follow up, schedule a colonoscopy   HISTORY OF PRESENT ILLNESS: [name] is a 62 year old male  with a past medical history of hypertension, hyperlipidemia and GERD, hemorrhoids s/p banding. Past laparoscopic banding surgery.   I initially saw [name] in the ED 10/29/2022. At that time, he presented to the ED after passing a large volume of  bright red blood per the rectum x 2 to 3 episodes with associated dizziness. Labs in the ED showed a WBC count of 11.4. Hg 14. 2. HCT 42.8. PLT 240. BUN 14. Cr 0.83. Normal LFTs.  CTA was not done.  He endorsed having nonbloody diarrhea x 2 to 3  episodes on Wed 10/28/2022. He had mild abdominal cramping, no severe abdominal pain. He passed 3 episodes of bright red blood without stool on Thursday 7/18. He takes ASA 81mg  daily, last dose was 3 on 7/16. No other NSAID use. The next day, he passed a moderate volume or bright red blood with a few blood clots in the am and a smaller volume of blood per there rectum later that evening without recurrence. Rectal exam showed edematous internal hemorrhoids and he was prescribed Anusol HC 25 mg suppositories nightly.  His hemoglobin level remained stable and he was discharged home 11/01/2022 with instructions to follow-up in our office to schedule an outpatient colonoscopy.  Hemoglobin level was 12.8 at time of discharge.  Repeat hemoglobin level 13.7 on 11/10/2022.  WBC count noted at 13.7.  He denies having any fever, abdominal pain or recent acute illness.  He endorses having mild fatigue.  No chest pain, palpitations, dizziness or shortness of breath.  He presents today for further GI follow-up. He continued to use Anusol HC suppository for 2 to 3 days after he was discharged home then stopped it because his rectal symptoms abated.  He passed a bowel movement last week which resulted in passing a normal bowel BM with a small amount of  bright red blood with anal irritation.  He restarted the Anusol suppositories for the next few days without any further rectal bleeding or discomfort.  He was last seen in office by Dr. Kinnie Scales 09/15/2019 and an EGD and colonoscopy were ordered. Patient stated he proceeded with an EGD and colonoscopy by Dr. Kinnie Scales in 2021, records not seen in Care Everywhere.    History of GERD for which he takes Pantoprazole 40mg  every day. He denies having any heartburn, dysphagia or upper abdominal pain.       Latest Ref Rng & Units 11/10/2022    3:30 PM 11/01/2022    4:24 AM 10/31/2022    5:55 AM  CBC  WBC 4.0 - 10.5 K/uL 13.7  9.1    Hemoglobin 13.0 - 17.0 g/dL 32.4  40.1  02.7   Hematocrit 39.0 - 52.0 % 42.2  38.7  39.9   Platelets 150.0 - 400.0 K/uL 295.0  225        GI PROCEDURES:   EGD 2021: Records not found in Care Everywhere   Colonoscopy 2021: Records not found in Care Everywhere   Colonoscopy 2013: Large internal hemorrhoids, no polyps  Social History:  reports that he quit smoking about 29 years ago. He started smoking about 44 years ago. He has a 30 pack-year smoking history. He has never used smokeless tobacco. He reports that he does not drink alcohol and does not use drugs.  Family History: family history  includes Arthritis in his father; CAD in his brother and paternal grandfather; Cancer in his mother; Heart attack in his father; Hypertension in his father and mother.  No Known Allergies    Outpatient Encounter Medications as of 11/16/2022  Medication Sig   albuterol (VENTOLIN HFA) 108 (90 Base) MCG/ACT inhaler Inhale 2 puffs into the lungs every 4 (four) hours as needed for shortness of breath.   aspirin 81 MG tablet Take 1 tablet (81 mg total) by mouth daily. HOLD x 1 week   buPROPion (WELLBUTRIN XL) 150 MG 24 hr tablet Take 1 tablet by mouth daily.   Cholecalciferol (VITAMIN D3) 5000 UNITS CAPS Take 5,000 Units by mouth daily.   Glucosamine HCl-MSM 750-750 MG TABS Take by  mouth daily.   hydrocortisone (ANUSOL-HC) 25 MG suppository Place 1 suppository (25 mg total) rectally at bedtime.   lisinopril (ZESTRIL) 10 MG tablet Take 1 tablet by mouth daily.   Multiple Vitamin (MULTIVITAMIN) tablet Take 1 tablet by mouth daily.   ondansetron (ZOFRAN) 4 MG tablet Take 1 tablet (4 mg total) by mouth every 8 (eight) hours as needed for nausea or vomiting.   pantoprazole (PROTONIX) 40 MG tablet Take 1 tablet (40 mg total) by mouth in the morning.   QUEtiapine (SEROQUEL) 100 MG tablet Take 150 mg by mouth at bedtime.   rosuvastatin (CRESTOR) 10 MG tablet Take 10 mg by mouth daily.   No facility-administered encounter medications on file as of 11/16/2022.    REVIEW OF SYSTEMS:  Gen: Denies fever, sweats or chills. No weight loss.  CV: Denies chest pain, palpitations or edema. Resp: Denies cough, shortness of breath of hemoptysis.  GI:See HPI. GU: Denies urinary burning, blood in urine, increased urinary frequency or incontinence. MS: Denies joint pain, muscles aches or weakness. Derm: Denies rash, itchiness, skin lesions or unhealing ulcers. Psych: Denies depression, anxiety, memory loss or confusion. Heme: Denies bruising, easy bleeding. Neuro:  Denies headaches, dizziness or paresthesias. Endo:  Denies any problems with DM, thyroid or adrenal function.  PHYSICAL EXAM: BP 112/60 (BP Location: Left Arm, Patient Position: Sitting, Cuff Size: Large)   Pulse 76   Ht 5\' 10"  (1.778 m)   Wt 258 lb 8 oz (117.3 kg)   BMI 37.09 kg/m   General: 61 year old male in no acute distress. Head: Normocephalic and atraumatic. Eyes:  Sclerae non-icteric, conjunctive pink. Ears: Normal auditory acuity. Mouth: Dentition intact. No ulcers or lesions.  Neck: Supple, no lymphadenopathy or thyromegaly.  Lungs: Clear bilaterally to auscultation without wheezes, crackles or rhonchi. Heart: Regular rate and rhythm. No murmur, rub or gallop appreciated.  Abdomen: Soft, nontender,  nondistended. Lap band palpated to the epigastric area.  No hepatosplenomegaly. Normoactive bowel sounds x 4 quadrants.  Rectal: Deferred.  Musculoskeletal: Symmetrical with no gross deformities. Skin: Warm and dry. No rash or lesions on visible extremities. Extremities: No edema. Neurological: Alert oriented x 4, no focal deficits.  Psychological:  Alert and cooperative. Normal mood and affect.  ASSESSMENT AND PLAN:  62 year old male admitted to the hospital 10/29/2022 with hematochezia. He had nonbloody diarrhea with lower abdominal cramping one day prior to onset of hematochezia. Hg 14.2 -> 13.4 -> Hg 13 -> 12.8.  Rectal exam and anoscopy showed nonbleeding inflamed internal hemorrhoids. Prescribed Anusol HC 25mg  suppository 1 PR at bedtime x 5 nights. Hemoglobin level 13.7 on 11/10/2022.  He had a small amount of bright red blood following a bowel movement x 1 day last week without recurrence. -Anusol HC  25 mg suppository 1 PR nightly x 5 nights as needed -Apply a small amount of Desitin inside the anal opening and to the external anal area three times daily as needed for anal or hemorrhoidal irritation/bleeding -Benefiber one tablespoon daily -Take Miralax 1 capful mixed in 8 ounces of water at bed time for constipation as tolerated -Colonoscopy benefits and risks discussed including risk with sedation, risk of bleeding, perforation and infection  -Consider future internal hemorrhoid banding after colonoscopy completed  History of GERD -Request EGD procedure and biopsy results from Dr. Kinnie Scales  -Continue Pantoprazole 40 mg p.o. daily  Past laparoscopic gastric surgery  Mildly elevated WBC count 13.7. Mild fatigue. No fevers.  -Recommended follow-up with PCP, recheck CBC in 2 weeks       CC:  [name], MD

## 2022-11-16 NOTE — Patient Instructions (Addendum)
You have been scheduled for a colonoscopy. Please follow written instructions given to you at your visit today.   Please pick up your prep supplies at the pharmacy within the next 1-3 days.  If you use inhalers (even only as needed), please bring them with you on the day of your procedure.  DO NOT TAKE 7 DAYS PRIOR TO TEST- Trulicity (dulaglutide) Ozempic, Wegovy (semaglutide) Mounjaro (tirzepatide) Bydureon Bcise (exanatide extended release)  DO NOT TAKE 1 DAY PRIOR TO YOUR TEST Rybelsus (semaglutide) Adlyxin (lixisenatide) Victoza (liraglutide) Byetta (exanatide) ___________________________________________________________________________  Follow up with your primary care doctor to recheck WBC in 2 weeks.  Miralax- every night as needed  Benefiber- 1 tablespoon daily  Due to recent changes in healthcare laws, you may see the results of your imaging and laboratory studies on MyChart before your provider has had a chance to review them.  We understand that in some cases there may be results that are confusing or concerning to you. Not all laboratory results come back in the same time frame and the provider may be waiting for multiple results in order to interpret others.  Please give [place] 48 hours in order for your provider to thoroughly review all the results before contacting the office for clarification of your results.   Thank you for trusting me with your gastrointestinal care!   Alcide Evener, CRNP

## 2022-12-01 DIAGNOSIS — D485 Neoplasm of uncertain behavior of skin: Secondary | ICD-10-CM | POA: Diagnosis not present

## 2022-12-02 ENCOUNTER — Encounter: Payer: Self-pay | Admitting: Internal Medicine

## 2022-12-11 ENCOUNTER — Ambulatory Visit (AMBULATORY_SURGERY_CENTER): Payer: BC Managed Care – PPO | Admitting: Internal Medicine

## 2022-12-11 ENCOUNTER — Encounter: Payer: Self-pay | Admitting: Internal Medicine

## 2022-12-11 VITALS — BP 130/78 | HR 59 | Temp 97.5°F | Resp 13 | Ht 70.0 in | Wt 258.0 lb

## 2022-12-11 DIAGNOSIS — D124 Benign neoplasm of descending colon: Secondary | ICD-10-CM | POA: Diagnosis not present

## 2022-12-11 DIAGNOSIS — D125 Benign neoplasm of sigmoid colon: Secondary | ICD-10-CM | POA: Diagnosis not present

## 2022-12-11 DIAGNOSIS — K625 Hemorrhage of anus and rectum: Secondary | ICD-10-CM | POA: Diagnosis not present

## 2022-12-11 DIAGNOSIS — K51411 Inflammatory polyps of colon with rectal bleeding: Secondary | ICD-10-CM | POA: Diagnosis not present

## 2022-12-11 MED ORDER — SODIUM CHLORIDE 0.9 % IV SOLN: 500.0000 mL | Freq: Once | INTRAVENOUS | Status: DC

## 2022-12-11 NOTE — Op Note (Signed)
[facility] Patient Name: Johnathan Sullivan Procedure Date: 12/11/2022 3:15 PM MRN: [id number] Endoscopist: Iva Boop , MD, [id number] Age: 62 Referring MD:  Date of Birth: Aug 02, 1960 Gender: Male Account #: [id number] Procedure:                Colonoscopy Indications:              Rectal bleeding Medicines:                Monitored Anesthesia Care Procedure:                Pre-Anesthesia Assessment:                           - Prior to the procedure, a History and Physical                            was performed, and patient medications and                            allergies were reviewed. The patient's tolerance of                            previous anesthesia was also reviewed. The risks                            and benefits of the procedure and the sedation                            options and risks were discussed with the patient.                            All questions were answered, and informed consent                            was obtained. Prior Anticoagulants: The patient has                            taken no anticoagulant or antiplatelet agents. ASA                            Grade Assessment: II - A patient with mild systemic                            disease. After reviewing the risks and benefits,                            the patient was deemed in satisfactory condition to                            undergo the procedure.                           After obtaining informed consent, the colonoscope  was passed under direct vision. Throughout the                            procedure, the patient's blood pressure, pulse, and                            oxygen saturations were monitored continuously. The                            CF [id number] #[id number] was introduced through the anus                            and advanced to the the cecum, identified by                            appendiceal orifice and ileocecal valve. The                             colonoscopy was performed without difficulty. The                            patient tolerated the procedure well. The quality                            of the bowel preparation was good. The ileocecal                            valve, appendiceal orifice, and rectum were                            photographed. The bowel preparation used was SUPREP                            via split dose instruction. Scope In: 3:23:29 PM Scope Out: 3:37:45 PM Scope Withdrawal Time: 0 hours 10 minutes 17 seconds  Total Procedure Duration: 0 hours 14 minutes 16 seconds  Findings:                 The perianal and digital rectal examinations were                            normal. Pertinent negatives include normal prostate                            (size, shape, and consistency).                           A 7 mm polyp was found in the descending colon. The                            polyp was semi-pedunculated. The polyp was removed                            with a hot snare. Resection and retrieval were  complete. Verification of patient identification                            for the specimen was done. Estimated blood loss:                            none.                           A 2 mm polyp was found in the sigmoid colon. The                            polyp was sessile. The polyp was removed with a                            cold snare. Resection and retrieval were complete.                            Verification of patient identification for the                            specimen was done. Estimated blood loss was minimal.                           Multiple diverticula were found in the sigmoid                            colon.                           Internal hemorrhoids were found.                           The exam was otherwise without abnormality on                            direct and retroflexion views. Complications:            No  immediate complications. Estimated Blood Loss:     Estimated blood loss was minimal. Impression:               - One 7 mm polyp in the descending colon, removed                            with a hot snare. Resected and retrieved.                           - One 2 mm polyp in the sigmoid colon, removed with                            a cold snare. Resected and retrieved.                           - Diverticulosis in the sigmoid colon.                           -  Internal hemorrhoids.                           - The examination was otherwise normal on direct                            and retroflexion views. Recommendation:           - Patient has a contact number available for                            emergencies. The signs and symptoms of potential                            delayed complications were discussed with the                            patient. Return to normal activities tomorrow.                            Written discharge instructions were provided to the                            patient.                           - Resume previous diet.                           - Continue present medications.                           - Await pathology results.                           - Repeat colonoscopy is recommended. The                            colonoscopy date will be determined after pathology                            results from today's exam become available for                            review.                           - No aspirin, ibuprofen, naproxen, or other                            non-steroidal anti-inflammatory drugs for 2 weeks                            after polyp removal. Iva Boop, MD 12/11/2022 3:47:41 PM This report has been signed electronically.

## 2022-12-11 NOTE — Progress Notes (Signed)
Uneventful anesthetic. Report to pacu rn. Vss. Care resumed by rn. 

## 2022-12-11 NOTE — Progress Notes (Signed)
History and Physical Interval Note:  12/11/2022 3:17 PM  Johnathan Sullivan  has presented today for endoscopic procedure(s), with the diagnosis of  Encounter Diagnosis  Name Primary?   Rectal bleeding Yes  .  The various methods of evaluation and treatment have been discussed with the patient and/or family. After consideration of risks, benefits and other options for treatment, the patient has consented to  the endoscopic procedure(s).   The patient's history has been reviewed, patient examined, no change in status, stable for endoscopic procedure(s).  I have reviewed the patient's chart and labs.  Questions were answered to the patient's satisfaction.     Iva Boop, MD, [facility]

## 2022-12-11 NOTE — Progress Notes (Signed)
Called to room to assist during endoscopic procedure.  Patient ID and intended procedure confirmed with present staff. Received instructions for my participation in the procedure from the performing physician.  

## 2022-12-11 NOTE — Patient Instructions (Addendum)
Recommendation:           - Patient has a contact number available for                            emergencies. The signs and symptoms of potential                            delayed complications were discussed with the                            patient. Return to normal activities tomorrow.                            Written discharge instructions were provided to the                            patient.                           - Resume previous diet.                           - Continue present medications.                           - Await pathology results.                           - Repeat colonoscopy is recommended. The                            colonoscopy date will be determined after pathology                            results from today's exam become available for                            review.                           - No aspirin, ibuprofen, naproxen, or other                            non-steroidal anti-inflammatory drugs for 2 weeks                            after polyp removal. Handouts on polyps, diverticulosis and hemorrhoids given.  YOU HAD AN ENDOSCOPIC PROCEDURE TODAY AT THE [facility]:   Refer to the procedure report that was given to you for any specific questions about what was found during the examination.  If the procedure report does not answer your questions, please call your gastroenterologist to clarify.  If you requested that your care partner not be given the details of your procedure findings, then the procedure report has been included in a sealed envelope for you to review at your convenience later.  YOU SHOULD EXPECT: Some feelings of bloating in the abdomen.  Passage of more gas than usual.  Walking can help get rid of the air that was put into your GI tract during the procedure and reduce the bloating. If you had a lower endoscopy (such as a colonoscopy or flexible sigmoidoscopy) you may notice spotting of blood in your stool or on the  toilet paper. If you underwent a bowel prep for your procedure, you may not have a normal bowel movement for a few days.  Please Note:  You might notice some irritation and congestion in your nose or some drainage.  This is from the oxygen used during your procedure.  There is no need for concern and it should clear up in a day or so.  SYMPTOMS TO REPORT IMMEDIATELY:  Following lower endoscopy (colonoscopy or flexible sigmoidoscopy):  Excessive amounts of blood in the stool  Significant tenderness or worsening of abdominal pains  Swelling of the abdomen that is new, acute  Fever of 100F or higher  For urgent or emergent issues, a gastroenterologist can be reached at any hour by calling [phone]. Do not use MyChart messaging for urgent concerns.    DIET:  We do recommend a small meal at first, but then you may proceed to your regular diet.  Drink plenty of fluids but you should avoid alcoholic beverages for 24 hours.  ACTIVITY:  You should plan to take it easy for the rest of today and you should NOT DRIVE or use heavy machinery until tomorrow (because of the sedation medicines used during the test).    FOLLOW UP: Our staff will call the number listed on your records the next business day following your procedure.  We will call around 7:15- 8:00 am to check on you and address any questions or concerns that you may have regarding the information given to you following your procedure. If we do not reach you, we will leave a message.     If any biopsies were taken you will be contacted by phone or by letter within the next 1-3 weeks.  Please call us at [phone] if you have not heard about the biopsies in 3 weeks.    SIGNATURES/CONFIDENTIALITY: You and/or your care partner have signed paperwork which will be entered into your electronic medical record.  These signatures attest to the fact that that the information above on your After Visit Summary has been reviewed and is  understood.  Full responsibility of the confidentiality of this discharge information lies with you and/or your care-partner.I found and removed 2 small polyps. You also have a condition called diverticulosis - common and not usually a problem. Please read the handout provided.  Hemorrhoids noted again also.  I will let you know pathology results and when to have another routine colonoscopy by mail and/or My Chart.  I appreciate the opportunity to care for you. Iva Boop, MD, [facility]

## 2022-12-15 ENCOUNTER — Telehealth: Payer: Self-pay | Admitting: *Deleted

## 2022-12-15 NOTE — Telephone Encounter (Signed)
  Follow up Call-     12/11/2022    2:44 PM  Call back number  Post procedure Call Back phone  # [phone]  Permission to leave phone message Yes     Patient questions:  Do you have a fever, pain , or abdominal swelling? No. Pain Score  0 *  Have you tolerated food without any problems? Yes.    Have you been able to return to your normal activities? Yes.    Do you have any questions about your discharge instructions: Diet   No. Medications  No. Follow up visit  No.  Do you have questions or concerns about your Care? No.  Actions: * If pain score is 4 or above: No action needed, pain <4.

## 2022-12-17 ENCOUNTER — Encounter: Payer: Self-pay | Admitting: Internal Medicine

## 2022-12-25 DIAGNOSIS — D485 Neoplasm of uncertain behavior of skin: Secondary | ICD-10-CM | POA: Diagnosis not present

## 2023-01-25 IMAGING — MR MR CARD MORPHOLOGY WO/W CM
45 of 48 series · 45 of 48 positions shown · IV contrast (Contrast agent)
Comparison: none

CLINICAL DATA: Possible hypertrophic cardiomyopathy. Dilated
ascending aorta.

EXAM:
CARDIAC MRI
TECHNIQUE: The patient was scanned on a 1.5 Tesla GE magnet. A dedicated
cardiac coil was used. Functional imaging was done using Fiesta
sequences. [DATE], and 4 chamber views were done to assess for RWMA's.
Modified Paola rule using a short axis stack was used to
calculate an ejection fraction on a dedicated work station using
Circle software. The patient received 8 cc of Gadavist. After 10
minutes inversion recovery sequences were used to assess for
infiltration and scar tissue. MR angiography was carried out.

[Series 4: t2_haste_db_tra_bh · axial · 8.0mm · 1.56mm/px · 1 of 16 slices shown]
[im 1/16]
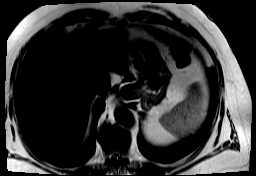

[Series 8: bSSFP · oblique · 8.0mm · 1.61mm/px · 1 of 25 slices shown (1 of 39)]
[im 1/25]
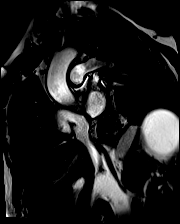

[Series 9: bSSFP · oblique · 8.0mm · 1.61mm/px · 1 of 25 slices shown (2 of 39)]
[im 1/25]
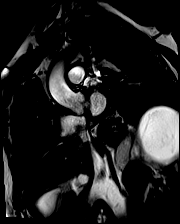

[Series 10: bSSFP · oblique · 8.0mm · 1.61mm/px · 1 of 25 slices shown (3 of 39)]
[im 1/25]
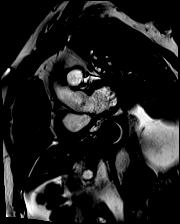

[Series 11: bSSFP · oblique · 8.0mm · 1.61mm/px · 1 of 25 slices shown (4 of 39)]
[im 1/25]
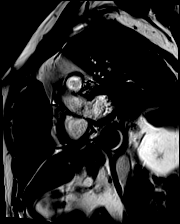

[Series 12: bSSFP · oblique · 8.0mm · 1.61mm/px · 1 of 25 slices shown (5 of 39)]
[im 1/25]
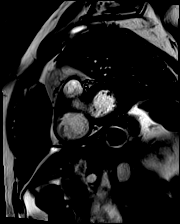

[Series 13: bSSFP · oblique · 8.0mm · 1.61mm/px · 1 of 25 slices shown (6 of 39)]
[im 1/25]
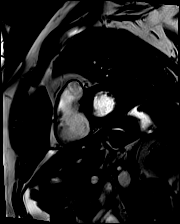

[Series 14: bSSFP · oblique · 8.0mm · 1.61mm/px · 1 of 25 slices shown (7 of 39)]
[im 1/25]
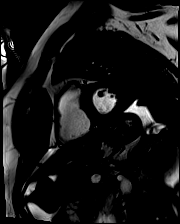

[Series 15: bSSFP · oblique · 8.0mm · 1.61mm/px · 1 of 25 slices shown (8 of 39)]
[im 1/25]
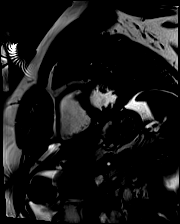

[Series 16: bSSFP · oblique · 8.0mm · 1.61mm/px · 1 of 25 slices shown (9 of 39)]
[im 1/25]
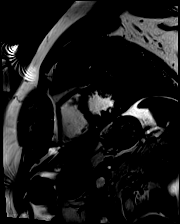

[Series 17: bSSFP · oblique · 8.0mm · 1.61mm/px · 1 of 25 slices shown (10 of 39)]
[im 1/25]
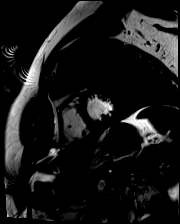

[Series 18: bSSFP · oblique · 8.0mm · 1.61mm/px · 1 of 25 slices shown (11 of 39)]
[im 1/25]
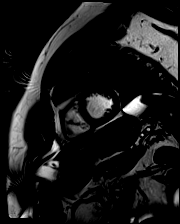

[Series 19: bSSFP · oblique · 8.0mm · 1.61mm/px · 1 of 25 slices shown (12 of 39)]
[im 1/25]
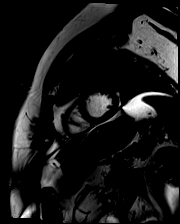

[Series 20: bSSFP · oblique · 8.0mm · 1.61mm/px · 1 of 25 slices shown (13 of 39)]
[im 1/25]
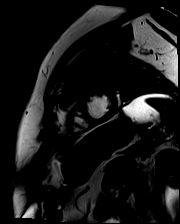

[Series 21: bSSFP · oblique · 8.0mm · 1.61mm/px · 1 of 25 slices shown (14 of 39)]
[im 1/25]
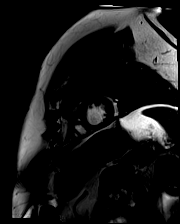

[Series 22: bSSFP · oblique · 8.0mm · 1.61mm/px · 1 of 25 slices shown (15 of 39)]
[im 1/25]
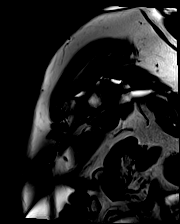

[Series 23: bSSFP · oblique · 8.0mm · 1.61mm/px · 1 of 25 slices shown (16 of 39)]
[im 1/25]
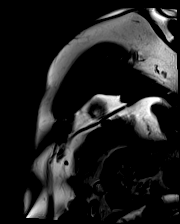

[Series 24: bSSFP · oblique · 8.0mm · 1.61mm/px · 1 of 25 slices shown (17 of 39)]
[im 1/25]
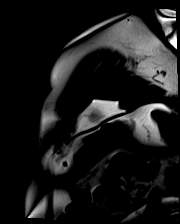

[Series 25: bSSFP · oblique · 8.0mm · 1.61mm/px · 1 of 25 slices shown (18 of 39)]
[im 1/25]
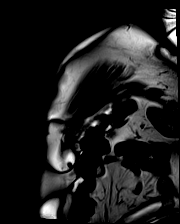

[Series 26: bSSFP · oblique · 6.0mm · 1.41mm/px · 1 of 25 slices shown (19 of 39)]
[im 1/25]
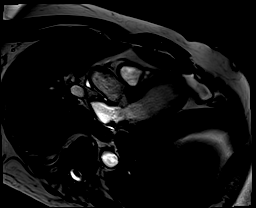

[Series 27: bSSFP · oblique · 6.0mm · 1.41mm/px · 1 of 25 slices shown (20 of 39)]
[im 1/25]
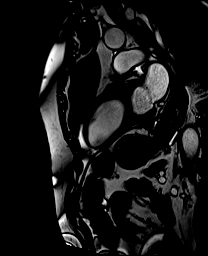

[Series 28: bSSFP · oblique · 6.0mm · 1.41mm/px · 1 of 25 slices shown (21 of 39)]
[im 1/25]
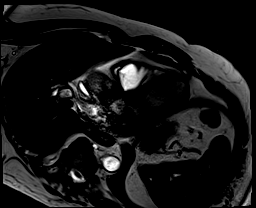

[Series 29: bSSFP · oblique · 6.0mm · 1.41mm/px · 1 of 25 slices shown (22 of 39)]
[im 1/25]
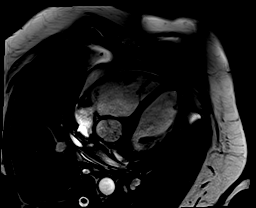

[Series 30: bSSFP · sagittal · 6.0mm · 1.41mm/px · 1 of 25 slices shown (23 of 39)]
[im 1/25]
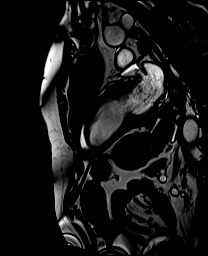

[Series 31: bSSFP · axial · 6.0mm · 1.41mm/px · 1 of 25 slices shown (24 of 39)]
[im 1/25]
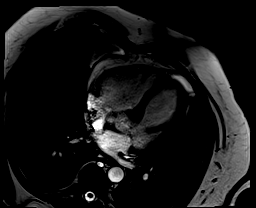

[Series 32: bSSFP · oblique · 6.0mm · 1.41mm/px · 1 of 25 slices shown (25 of 39)]
[im 1/25]
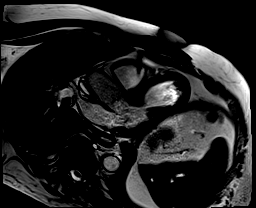

[Series 33: bSSFP · axial · 6.0mm · 1.61mm/px · 1 of 25 slices shown (26 of 39)]
[im 1/25]
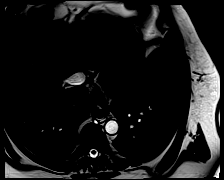

[Series 33: bSSFP · axial · 6.0mm · 1.61mm/px · 1 of 25 slices shown (27 of 39)]
[im 1/25]
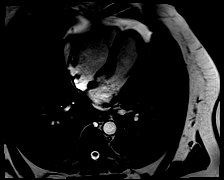

[Series 33: bSSFP · axial · 6.0mm · 1.61mm/px · 1 of 25 slices shown (28 of 39)]
[im 1/25]
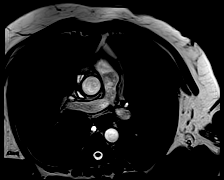

[Series 33: bSSFP · axial · 6.0mm · 1.61mm/px · 1 of 25 slices shown (29 of 39)]
[im 1/25]
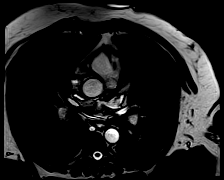

[Series 33: bSSFP · axial · 6.0mm · 1.61mm/px · 1 of 25 slices shown (30 of 39)]
[im 1/25]
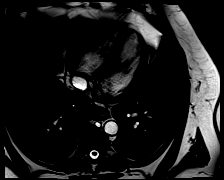

[Series 33: bSSFP · axial · 6.0mm · 1.61mm/px · 1 of 25 slices shown (31 of 39)]
[im 1/25]
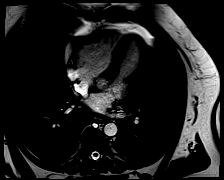

[Series 33: bSSFP · axial · 6.0mm · 1.61mm/px · 1 of 25 slices shown (32 of 39)]
[im 1/25]
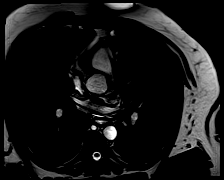

[Series 33: bSSFP · axial · 6.0mm · 1.61mm/px · 1 of 25 slices shown (33 of 39)]
[im 1/25]
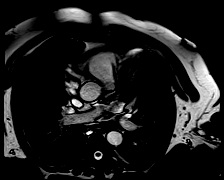

[Series 33: bSSFP · axial · 6.0mm · 1.61mm/px · 1 of 25 slices shown (34 of 39)]
[im 1/25]
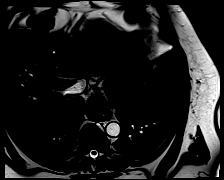

[Series 33: bSSFP · axial · 6.0mm · 1.61mm/px · 1 of 25 slices shown (35 of 39)]
[im 1/25]
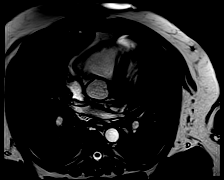

[Series 33: bSSFP · axial · 6.0mm · 1.61mm/px · 1 of 25 slices shown (36 of 39)]
[im 1/25]
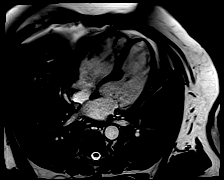

[Series 33: bSSFP · axial · 6.0mm · 1.61mm/px · 1 of 25 slices shown (37 of 39)]
[im 1/25]
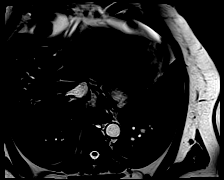

[Series 33: bSSFP · axial · 6.0mm · 1.61mm/px · 1 of 25 slices shown (38 of 39)]
[im 1/25]
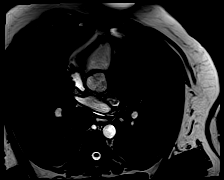

[Series 33: bSSFP · axial · 6.0mm · 1.61mm/px · 1 of 25 slices shown (39 of 39)]
[im 1/25]
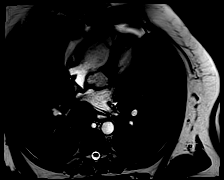

[Series 34: (id)_long_t1 · oblique · 8.0mm · 1.56mm/px · 1 of 24 slices shown]
[im 1/24]
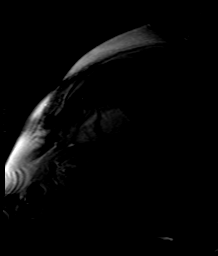

[Series 35: (id)_long_t1_moco · oblique · 8.0mm · 1.56mm/px · 1 of 24 slices shown]
[im 1/24]
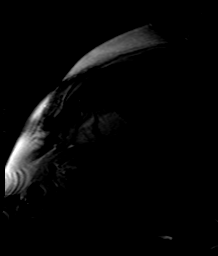

[Series 36: (id)_long_t1_moco_t1 · oblique · 8.0mm · 1.56mm/px · 1 of 6 slices shown]
[im 1/6]
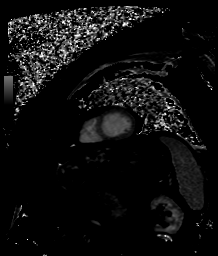

[Series 38: cine_trufi_cs_rt_short axis · axial · 6.0mm · 1.73mm/px · 1 of 16 slices shown (1 of 2)]
[im 1/16]
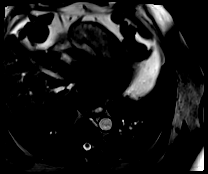

[Series 38: cine_trufi_cs_rt_short axis · axial · 6.0mm · 1.73mm/px · 1 of 16 slices shown (2 of 2)]
[im 1/16]
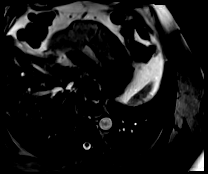

[45 of 48 positions shown; findings below may reference images not displayed]

FINDINGS: Limited images of the lung fields showed no gross abnormalities.

Normal left ventricular size. There is severe focal basal septal
hypertrophy, the remainder of the ventricle has normal wall
thickness. The left ventricular outflow tract is narrowed. I do not
visualize mitral valve systolic anterior motion. Normal wall motion
with EF 60%. Upper normal right ventricular size with EF 46%. Normal
right and left atrial sizes. Trileaflet aortic valve with no
significant stenosis or regurgitation. No significant mitral
regurgitation.

On delayed enhancement imaging, no myocardial LGE was noted.

MR angiogram: The right carotid artery arises directly from the
aortic arch. Aberrant right subclavian arises distal to the left
subclavian from the distal arch and courses posterior to the trachea
and esophagus. Pulmonary veins drain normally to the left atrium.
Aortic root at sinuses of valsalva 3.7 cm, ascending aorta 3.2 cm,
aortic arch 2.9 cm.

Measurements:

LVEDV 158 mL

LVSV 94 mL
LVEF 60%

RVEDV 183 mL
RVSV 84 mL
RVEF 46%

ECV 26%
IMPRESSION: 1. Normal LV size with an area of severe focal basal septal
hypertrophy. The LV outflow tract is narrowed but there is no mitral
valve ROMBY visualized. Normal LV EF 60%.

2.  Upper normal RV size with EF 46%.

3.  No myocardial LGE.

4.  Incidentally noted aberrant right subclavian artery.

5.  Minimally enlarged aortic root (3.7 cm).

Possible variant of hypertrophic cardiomyopathy. No LGE seen, no
mitral valve ROMBY. In the absence of significant LV outflow tract
gradient by echo, suspect low risk.

Trae Bao

## 2023-02-02 ENCOUNTER — Telehealth: Payer: Self-pay | Admitting: Internal Medicine

## 2023-02-02 NOTE — Telephone Encounter (Signed)
Patient called stated he was told in August after his colonoscopy that he can call to book for a banding procedure, please advise.

## 2023-02-02 NOTE — Telephone Encounter (Signed)
Pt stated that he has been having ongoing bleeding with his Hemorrhoids and stated that it was mentioned by Dr. Leone Payor that he may need a banding in the future.  Pt chart was reviewed. Pt was scheduled for 03/03/2023 at 2:50 PM for a possible hem band. Pt made aware Dr.  Leone Payor will do an assessment at this time and band upon what he assessed. Pt made aware. Pt verbalized understanding with all questions answered.

## 2023-02-04 NOTE — Progress Notes (Signed)
Chief Complaint  Patient presents with   Follow-up    LVH   History of Present Illness: 62 yo male with history of HLD, GERD, former tobacco abuse and HTN here today for cardiac follow up. I saw him as a new patient in February 2021 to establish cardiology care. He has a strong family history of CAD (PGF, father and brother with CAD). He has no personal history of CAD. He felt well overall with no chest pain, LE edema, dizziness, near syncope or syncope. Mild dyspnea with heavy exertion. Echo March 2021 with LVEF=60-65%. 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 No valve disease. Exercise stress test without ischemia. Mild dilation of the ascending aorta by CTA April 2021 at 3.7 cm. Lung nodules on CT in April 2021 that are stable by non contrast CT in January 2022. He has no family history of sudden cardiac death. Cardiac MRI 06-May-2022with normal LV size with focal severe basal septal hypertrophy. No SAM. LVEF around 60%. Aortic root 3.7 cm.   He is here today for follow up. The patient denies any chest pain, dyspnea, palpitations, lower extremity edema, orthopnea, PND, dizziness, near syncope or syncope.   Primary Care Physician: [name], MD  Past Medical History:  Diagnosis Date   Allergy    Anxiety    GERD (gastroesophageal reflux disease)    Hyperlipidemia    Labile hypertension    Unspecified vitamin D deficiency     Past Surgical History:  Procedure Laterality Date   HEMORRHOID BANDING  09/2011   LAPAROSCOPIC GASTRIC BANDING  2009    Current Outpatient Medications  Medication Sig Dispense Refill   aspirin 81 MG tablet Take 1 tablet (81 mg total) by mouth daily. HOLD x 1 week     buPROPion (WELLBUTRIN XL) 150 MG 24 hr tablet Take 1 tablet by mouth daily.     Cholecalciferol (VITAMIN D3) 5000 UNITS CAPS Take 5,000 Units by mouth daily.     Glucosamine HCl-MSM 750-750 MG TABS Take by mouth daily.     lisinopril  (ZESTRIL) 10 MG tablet Take 1 tablet by mouth daily.     Multiple Vitamin (MULTIVITAMIN) tablet Take 1 tablet by mouth daily.     pantoprazole (PROTONIX) 40 MG tablet Take 1 tablet (40 mg total) by mouth in the morning. 30 tablet 3   QUEtiapine (SEROQUEL) 100 MG tablet Take 150 mg by mouth at bedtime.     rosuvastatin (CRESTOR) 10 MG tablet Take 10 mg by mouth daily.     albuterol (VENTOLIN HFA) 108 (90 Base) MCG/ACT inhaler Inhale 2 puffs into the lungs every 4 (four) hours as needed for shortness of breath.     hydrocortisone (ANUSOL-HC) 25 MG suppository Place 1 suppository (25 mg total) rectally at bedtime. (Patient not taking: Reported on 02/05/2023) 30 suppository 0   ondansetron (ZOFRAN) 4 MG tablet Take 1 tablet (4 mg total) by mouth every 8 (eight) hours as needed for nausea or vomiting. (Patient not taking: Reported on 12/11/2022) 20 tablet 0   No current facility-administered medications for this visit.    No Known Allergies  Social History   Socioeconomic History   Marital status: Married    Spouse name: Not on file   Number of children: 2   Years of education: Not on file   Highest education level: Not on file  Occupational History   Occupation: network Tech  Tobacco Use   Smoking status: Former  Current packs/day: 0.00    Average packs/day: 2.0 packs/day for 15.0 years (30.0 ttl pk-yrs)    Types: Cigarettes    Start date: 05/18/1978    Quit date: 05/18/1993    Years since quitting: 29.7   Smokeless tobacco: Never  Vaping Use   Vaping status: Never Used  Substance and Sexual Activity   Alcohol use: No   Drug use: No   Sexual activity: Not on file  Other Topics Concern   Not on file  Social History Narrative   Not on file   Social Determinants of Health   Financial Resource Strain: Low Risk  (09/24/2022)   Received [facility]   Overall Financial Resource Strain (CARDIA)    Difficulty of Paying Living Expenses: Not hard at all  Food Insecurity: No Food  Insecurity (10/29/2022)   Hunger Vital Sign    Worried About Running Out of Food in the Last Year: Never true    Ran Out of Food in the Last Year: Never true  Transportation Needs: No Transportation Needs (10/29/2022)   PRAPARE - Administrator, Civil Service (Medical): No    Lack of Transportation (Non-Medical): No  Physical Activity: Inactive (09/24/2022)   Received from [facility]   Exercise Vital Sign    Days of Exercise per Week: 0 days    Minutes of Exercise per Session: 30 min  Stress: No Stress Concern Present (09/24/2022)   Received from [facility] of Occupational Health - Occupational Stress Questionnaire    Feeling of Stress : Not at all  Social Connections: Moderately Integrated (09/24/2022)   Received from [facility]   Social Network    How would you rate your social network (family, work, friends)?: Adequate participation with social networks  Intimate Partner Violence: Not At Risk (10/29/2022)   Humiliation, Afraid, Rape, and Kick questionnaire    Fear of Current or Ex-Partner: No    Emotionally Abused: No    Physically Abused: No    Sexually Abused: No    Family History  Problem Relation Age of Onset   Breast cancer Mother    Hypertension Mother    Diabetes type II Mother    Hypertension Father    Heart attack Father    Arthritis Father    CAD Brother    CAD Paternal Grandfather    Colon cancer Neg Hx    Esophageal cancer Neg Hx    Stomach cancer Neg Hx    Rectal cancer Neg Hx     Review of Systems:  As stated in the HPI and otherwise negative.   BP 110/78   Pulse 76   Ht 5\' 10"  (1.778 m)   Wt 118.9 kg   SpO2 97%   BMI 37.62 kg/m   Physical Examination:. General: Well developed, well nourished, NAD  HEENT: OP clear, mucus membranes moist  SKIN: warm, dry. No rashes. Neuro: No focal deficits  Musculoskeletal: Muscle strength 5/5 all ext  Psychiatric: Mood and affect normal  Neck: No JVD, no carotid bruits,  no thyromegaly, no lymphadenopathy.  Lungs:Clear bilaterally, no wheezes, rhonci, crackles Cardiovascular: Regular rate and rhythm. No murmurs, gallops or rubs. Abdomen:Soft. Bowel sounds present. Non-tender.  Extremities: No lower extremity edema. Pulses are 2 + in the bilateral DP/PT.  EKG:  EKG is ordered today. The ekg ordered today demonstrates  EKG Interpretation Date/Time:  Friday February 05 2023 09:27:32 EDT Ventricular Rate:  69 PR Interval:  222 QRS Duration:  108 QT Interval:  396 QTC Calculation: 424 R Axis:   37  Text Interpretation: Sinus rhythm with 1st degree A-V block Confirmed by Verne Carrow [id number]) on 02/05/2023 9:37:21 AM    Echo 07/11/19:  1. Left ventricular ejection fraction, by estimation, is 60 to 65%. The  left ventricle has normal function. The left ventricle has no regional  wall motion abnormalities. There is severe asymmetric left ventricular  hypertrophy of the basal-septal  segment. No LVOT obstruction. Left ventricular diastolic parameters were  normal.   2. Right ventricular systolic function is normal. The right ventricular  size is moderately enlarged. There is normal pulmonary artery systolic  pressure.   3. The mitral valve is normal in structure. No evidence of mitral valve  regurgitation.   4. The aortic valve is tricuspid. Aortic valve regurgitation is not  visualized. No aortic stenosis is present.   5. Aortic dilatation noted. There is dilatation of the ascending aorta  measuring 41 mm.   Recent Labs: 10/30/2022: ALT 18 11/10/2022: BUN 11; Creatinine, Ser 0.98; Hemoglobin 13.6; Platelets 295.0; Potassium 3.9; Sodium 139   Lipid Panel    Component Value Date/Time   CHOL 180 09/18/2014 1110   TRIG 63 09/18/2014 1110   HDL 53 09/18/2014 1110   CHOLHDL 3.4 09/18/2014 1110   VLDL 13 09/18/2014 1110   LDLCALC 114 (H) 09/18/2014 1110     Wt Readings from Last 3 Encounters:  02/05/23 118.9 kg  12/11/22 117 kg  11/16/22  117.3 kg    Assessment and Plan:   1. Cardiovascular screening: Normal LV systolic function and normal exercise stress test in 2021. Normal LV function by cardiac MR in 2022. He has no concerning symptoms. Continue ASA and statin.    2. HTN: Followed in primary care. BP is controlled. No changes  3. Hyperlipidemia: Followed in primary care. Continue statin  4. 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 No LVOT obstruction. Cardiac MRI in May 2022 with severe focal basal septal hypertrophy with no SAM. He has no family history of sudden cardiac death. No changes.   5. 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 No need to repeat a scan at this time  Labs/ tests ordered today include:   Orders Placed This Encounter  Procedures   EKG 12-Lead   Disposition:   F/U with me in 12 months   Signed, Verne Carrow, MD 02/05/2023 9:43 AM    [facility] [address] [place], [place], [place]  [address] Phone: [phone]; Fax: [phone]

## 2023-02-05 ENCOUNTER — Encounter: Payer: Self-pay | Admitting: Cardiovascular Disease

## 2023-02-05 ENCOUNTER — Ambulatory Visit: Payer: BC Managed Care – PPO | Attending: Cardiovascular Disease | Admitting: Cardiovascular Disease

## 2023-02-05 VITALS — BP 110/78 | HR 76 | Ht 70.0 in | Wt 262.2 lb

## 2023-02-05 DIAGNOSIS — I10 Essential (primary) hypertension: Secondary | ICD-10-CM | POA: Diagnosis not present

## 2023-02-05 DIAGNOSIS — I517 Cardiomegaly: Secondary | ICD-10-CM

## 2023-02-05 NOTE — Patient Instructions (Signed)

## 2023-03-03 ENCOUNTER — Telehealth: Payer: Self-pay | Admitting: Internal Medicine

## 2023-03-03 ENCOUNTER — Encounter: Payer: BC Managed Care – PPO | Admitting: Internal Medicine

## 2023-03-03 NOTE — Telephone Encounter (Signed)
OK no charge ?

## 2023-03-03 NOTE — Telephone Encounter (Signed)
Good afternoon Dr. Leone Payor,  Patients wife called stating patient would not be able to make his 2:50 appointment with you today due to a family emergency.  Patients wife stated she will have patient call back at a later time to reschedule.

## 2023-10-08 ENCOUNTER — Ambulatory Visit: Admitting: Internal Medicine

## 2024-01-18 ENCOUNTER — Encounter: Payer: Self-pay | Admitting: Internal Medicine

## 2024-01-18 ENCOUNTER — Ambulatory Visit (INDEPENDENT_AMBULATORY_CARE_PROVIDER_SITE_OTHER): Admitting: Internal Medicine

## 2024-01-18 VITALS — BP 102/70 | HR 95 | Ht 70.0 in | Wt 281.6 lb

## 2024-01-18 DIAGNOSIS — K641 Second degree hemorrhoids: Secondary | ICD-10-CM | POA: Diagnosis not present

## 2024-01-18 DIAGNOSIS — K648 Other hemorrhoids: Secondary | ICD-10-CM

## 2024-01-18 NOTE — Patient Instructions (Signed)

## 2024-01-18 NOTE — Progress Notes (Signed)
 Johnathan Sullivan 63 y.o. Mar 08, 1961 [id number]  Assessment & Plan:   Encounter Diagnosis  Name Primary?   Bleeding internal hemorrhoids Yes   Successful banding today.  Dietary and behavioral recommendations were given and along with follow-up instructions.     The following adjunctive treatments were recommended:  Benefiber 1 tablespoon daily x 1 month  The patient will return in about 2 months for  follow-up and possible additional banding as required. No complications were encountered and the patient tolerated the procedure well.   CC: Sophronia Ozell BROCKS, MD   Subjective:   Chief Complaint: Rectal bleeding from hemorrhoids  HPI Johnathan Sullivan is a 63 year old male who presents with persistent rectal bleeding due to internal hemorrhoids.  These were found a colonoscopy last year and hemorrhoidal banding was recommended but he has been unable to make it for that yet.  He experiences bright red blood on the tissue with every bowel movement, occurring almost daily, with only one day out of seven being an exception. No associated symptoms such as itching, burning, constipation, or diarrhea. He describes the hemorrhoids as 'annoying' but not painful.  The hemorrhoids occasionally prolapse, feeling like they 'pop out and go back in.' He does not engage in heavy lifting, with nothing over about ten pounds, and he does not take major blood thinners, only a baby aspirin  for his heart.   No trouble moving his bowels, constipation, diarrhea, or allergies to latex.  Colonoscopy 12/11/2022 Impression:               - One 7 mm polyp in the descending colon, removed                            with a hot snare. Resected and retrieved.                           - One 2 mm polyp in the sigmoid colon, removed with                            a cold snare. Resected and retrieved.                           - Diverticulosis in the sigmoid colon.                           - Internal  hemorrhoids.                           - The examination was otherwise normal on direct                            and retroflexion views.  Path - inflammatory and hpp  No Known Allergies Current Meds  Medication Sig   aspirin  81 MG tablet Take 1 tablet (81 mg total) by mouth daily. HOLD x 1 week   Cholecalciferol (VITAMIN D3) 5000 UNITS CAPS Take 5,000 Units by mouth daily.   Glucosamine HCl-MSM 750-750 MG TABS Take by mouth daily.   hydrocortisone  (ANUSOL -HC) 25 MG suppository Place 1 suppository (25 mg total) rectally at bedtime. (Patient taking differently: Place 25 mg rectally as needed.)   lisinopril (ZESTRIL) 10 MG  tablet Take 1 tablet by mouth daily.   Multiple Vitamin (MULTIVITAMIN) tablet Take 1 tablet by mouth daily.   pantoprazole  (PROTONIX ) 40 MG tablet Take 1 tablet (40 mg total) by mouth in the morning.   QUEtiapine (SEROQUEL) 300 MG tablet Take 300 mg by mouth at bedtime.   rosuvastatin  (CRESTOR ) 10 MG tablet Take 10 mg by mouth daily.   Past Medical History:  Diagnosis Date   Allergy    Anxiety    GERD (gastroesophageal reflux disease)    Hyperlipidemia    Labile hypertension    Unspecified vitamin D  deficiency    Past Surgical History:  Procedure Laterality Date   HEMORRHOID BANDING  09/2011   LAPAROSCOPIC GASTRIC BANDING  2009     family history includes Arthritis in his father; Breast cancer in his mother; CAD in his brother and paternal grandfather; Diabetes type II in his mother; Heart attack in his father; Hypertension in his father and mother.   Review of Systems As per HPI  Objective:   Physical Exam BP 102/70   Pulse 95   Ht 5' 10 (1.778 m)   Wt 281 lb 9.6 oz (127.7 kg)   BMI 40.41 kg/m   Rectal exam there is a bulge at the right posterior aspect of the anus consistent with a mildly prolapsed hemorrhoid complex.  Digital exam is normal with brown stool no mass no blood.  Anoscopy - Gr 2 RP>LL internal hemorrhoid columns w/ stigmata of  prior bleeding and Gr 1 RA internal hemorrhoid column no stigmata of bleeding.  PROCEDURE NOTE: The patient presents with symptomatic grade 2  hemorrhoids, requesting rubber band ligation of his/her hemorrhoidal disease.  All risks, benefits and alternative forms of therapy were described and informed consent was obtained.   The anorectum was pre-medicated with 0.125% nitroglycerin and 5% lidocaine The decision was made to band the right posterior and left lateral internal hemorrhoid complexes, and the [facility] O'Regan System was used to perform band ligation without complication.  Digital anorectal examination was then performed to assure proper positioning of the band, and to adjust the banded tissue as required.  The patient was discharged home without pain or other issues.

## 2024-03-22 ENCOUNTER — Ambulatory Visit: Attending: Cardiology | Admitting: Cardiovascular Disease

## 2024-03-22 VITALS — BP 116/74 | HR 71 | Ht 70.0 in | Wt 269.0 lb

## 2024-03-22 DIAGNOSIS — I10 Essential (primary) hypertension: Secondary | ICD-10-CM | POA: Diagnosis not present

## 2024-03-22 DIAGNOSIS — I517 Cardiomegaly: Secondary | ICD-10-CM | POA: Diagnosis not present

## 2024-03-22 NOTE — Progress Notes (Signed)
 Chief Complaint  Patient presents with   Follow-up    Septal Basal hypertrophy of the LV   History of Present Illness: 63 yo male with history of HLD, asymmetric basal septal hypertrophy of the left ventricle, former tobacco abuse and HTN here today for cardiac follow up. I saw him as a new patient in February 2021 to establish cardiology care. He has a strong family history of CAD (PGF, father and brother with CAD). He has no personal history of CAD. Echo March 2021 with LVEF=60-65%. 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 No valve disease. Exercise stress test without ischemia. Mild dilation of the ascending aorta by CTA April 2021 at 3.7 cm. Lung nodules on CT in April 2021 that are stable by non contrast CT in January 2022. He has no family history of sudden cardiac death. Cardiac MRI 12-Jun-2022with normal LV size with focal severe basal septal hypertrophy. No SAM. LVEF around 60%. Aortic root 3.7 cm.   He is here today for follow up. The patient denies any chest pain, dyspnea, palpitations, lower extremity edema, orthopnea, PND, dizziness, near syncope or syncope.   Primary Care Physician: Sophronia Ozell BROCKS, MD  Past Medical History:  Diagnosis Date   Allergy    Anxiety    GERD (gastroesophageal reflux disease)    Hyperlipidemia    Labile hypertension    Unspecified vitamin D  deficiency     Past Surgical History:  Procedure Laterality Date   HEMORRHOID BANDING     LAPAROSCOPIC GASTRIC BANDING  04/14/2007    Current Outpatient Medications  Medication Sig Dispense Refill   aspirin  81 MG tablet Take 1 tablet (81 mg total) by mouth daily. HOLD x 1 week     Cholecalciferol (VITAMIN D3) 5000 UNITS CAPS Take 5,000 Units by mouth daily.     Glucosamine HCl-MSM 750-750 MG TABS Take by mouth daily.     hydrocortisone  (ANUSOL -HC) 25 MG suppository Place 1 suppository (25 mg total) rectally at bedtime. (Patient taking differently: Place  25 mg rectally as needed.) 30 suppository 0   lisinopril (ZESTRIL) 10 MG tablet Take 1 tablet by mouth daily.     Multiple Vitamin (MULTIVITAMIN) tablet Take 1 tablet by mouth daily.     pantoprazole  (PROTONIX ) 40 MG tablet Take 1 tablet (40 mg total) by mouth in the morning. 30 tablet 3   QUEtiapine (SEROQUEL) 300 MG tablet Take 300 mg by mouth at bedtime.     rosuvastatin  (CRESTOR ) 10 MG tablet Take 10 mg by mouth daily.     No current facility-administered medications for this visit.    No Known Allergies  Social History   Socioeconomic History   Marital status: Married    Spouse name: Not on file   Number of children: 2   Years of education: Not on file   Highest education level: Not on file  Occupational History   Occupation: network Tech  Tobacco Use   Smoking status: Former    Current packs/day: 0.00    Average packs/day: 2.0 packs/day for 15.0 years (30.0 ttl pk-yrs)    Types: Cigarettes    Start date: 05/18/1978    Quit date: 05/18/1993    Years since quitting: 30.8   Smokeless tobacco: Never  Vaping Use   Vaping status: Never Used  Substance and Sexual Activity   Alcohol use: No   Drug use: No   Sexual activity: Not on file  Other Topics Concern  Not on file  Social History Narrative   Not on file   Social Drivers of Health   Financial Resource Strain: Low Risk (11/30/2023)   Received from [facility]   Overall Financial Resource Strain (CARDIA)    How hard is it for you to pay for the very basics like food, housing, medical care, and heating?: Not hard at all  Food Insecurity: No Food Insecurity (11/30/2023)   Received from [facility]   Hunger Vital Sign    Within the past 12 months, you worried that your food would run out before you got the money to buy more.: Never true    Within the past 12 months, the food you bought just didn't last and you didn't have money to get more.: Never true  Transportation Needs: No Transportation Needs (11/30/2023)    Received from [facility] - Transportation    In the past 12 months, has lack of transportation kept you from medical appointments or from getting medications?: No    In the past 12 months, has lack of transportation kept you from meetings, work, or from getting things needed for daily living?: No  Physical Activity: Insufficiently Active (11/30/2023)   Received from [facility]   Exercise Vital Sign    On average, how many days per week do you engage in moderate to strenuous exercise (like a brisk walk)?: 4 days    On average, how many minutes do you engage in exercise at this level?: 30 min  Stress: No Stress Concern Present (11/30/2023)   Received from [facility] of Occupational Health - Occupational Stress Questionnaire    Do you feel stress - tense, restless, nervous, or anxious, or unable to sleep at night because your mind is troubled all the time - these days?: Not at all  Social Connections: Socially Integrated (11/30/2023)   Received from [facility]   Social Network    How would you rate your social network (family, work, friends)?: Good participation with social networks  Intimate Partner Violence: Not At Risk (11/30/2023)   Received from [facility]   HITS    Over the last 12 months how often did your partner physically hurt you?: Never    Over the last 12 months how often did your partner insult you or talk down to you?: Never    Over the last 12 months how often did your partner threaten you with physical harm?: Never    Over the last 12 months how often did your partner scream or curse at you?: Never    Family History  Problem Relation Age of Onset   Breast cancer Mother    Hypertension Mother    Diabetes type II Mother    Hypertension Father    Heart attack Father    Arthritis Father    CAD Brother    CAD Paternal Grandfather    Colon cancer Neg Hx    Esophageal cancer Neg Hx    Stomach cancer Neg Hx    Rectal cancer Neg Hx      Review of Systems:  As stated in the HPI and otherwise negative.   BP 116/74 (BP Location: Right Arm)   Pulse 71   Ht 5' 10 (1.778 m)   Wt 269 lb (122 kg)   SpO2 96%   BMI 38.60 kg/m   Physical Examination:. General: Well developed, well nourished, NAD  HEENT: OP clear, mucus membranes moist  SKIN: warm, dry.  No rashes. Neuro: No focal deficits  Musculoskeletal: Muscle strength 5/5 all ext  Psychiatric: Mood and affect normal  Neck: No JVD, no carotid bruits, no thyromegaly, no lymphadenopathy.  Lungs:Clear bilaterally, no wheezes, rhonci, crackles Cardiovascular: Regular rate and rhythm. No murmurs, gallops or rubs. Abdomen:Soft. Bowel sounds present. Non-tender.  Extremities: No lower extremity edema. Pulses are 2 + in the bilateral DP/PT.  EKG:  EKG is ordered today. The ekg ordered today demonstrates   Recent Labs: No results found for requested labs within last 365 days.   Lipid Panel    Component Value Date/Time   CHOL 180 09/18/2014 1110   TRIG 63 09/18/2014 1110   HDL 53 09/18/2014 1110   CHOLHDL 3.4 09/18/2014 1110   VLDL 13 09/18/2014 1110   LDLCALC 114 (H) 09/18/2014 1110     Wt Readings from Last 3 Encounters:  03/22/24 269 lb (122 kg)  01/18/24 281 lb 9.6 oz (127.7 kg)  02/05/23 262 lb 3.2 oz (118.9 kg)    Assessment and Plan:   1. Cardiovascular screening: Normal LV systolic function and normal exercise stress test in 2021. Normal LV function by cardiac MR in 2022. No chest pain. Continue ASA and statin.     2. HTN: Followed in primary care. BP is well controlled. Continue current therapy.   3. Hyperlipidemia: Followed in primary care. Continue statin.   4. 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 No LVOT obstruction. Cardiac MRI in May 2022 with severe focal basal septal hypertrophy with no SAM. He has no family history of sudden cardiac death. He feels well overall. Will repeat his echo now. Will review need for  referral to HCM clinic.   5. 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 No need to repeat a scan at this time  Labs/ tests ordered today include:   Orders Placed This Encounter  Procedures   EKG 12-Lead   Disposition:   F/U with me in 12 months   Signed, Lonni Cash, MD 03/22/2024 4:38 PM    [facility] [address] [place], [place], [place]  [address] Phone: [phone]; Fax: [phone]

## 2024-03-22 NOTE — Patient Instructions (Signed)
 Medication Instructions:  Your physician recommends that you continue on your current medications as directed. Please refer to the Current Medication list given to you today.  *If you need a refill on your cardiac medications before your next appointment, please call your pharmacy*  Lab Work: none If you have labs (blood work) drawn today and your tests are completely normal, you will receive your results only by: MyChart Message (if you have MyChart) OR A paper copy in the mail If you have any lab test that is abnormal or we need to change your treatment, we will call you to review the results.  Testing/Procedures: Your physician has requested that you have an echocardiogram. Echocardiography is a painless test that uses sound waves to create images of your heart. It provides your doctor with information about the size and shape of your heart and how well your heart's chambers and valves are working. This procedure takes approximately one hour. There are no restrictions for this procedure. Please do NOT wear cologne, perfume, aftershave, or lotions (deodorant is allowed). Please arrive 15 minutes prior to your appointment time.  Please note: We ask at that you not bring children with you during ultrasound (echo/ vascular) testing. Due to room size and safety concerns, children are not allowed in the ultrasound rooms during exams. Our front office staff cannot provide observation of children in our lobby area while testing is being conducted. An adult accompanying a patient to their appointment will only be allowed in the ultrasound room at the discretion of the ultrasound technician under special circumstances. We apologize for any inconvenience.   Follow-Up: At [facility], you and your health needs are our priority.  As part of our continuing mission to provide you with exceptional heart care, our providers are all part of one team.  This team includes your primary Cardiologist  (physician) and Advanced Practice Providers or APPs (Physician Assistants and Nurse Practitioners) who all work together to provide you with the care you need, when you need it.  Your next appointment:   12 month(s)  Provider:   Lonni Cash, MD    We recommend signing up for the patient portal called MyChart.  Sign up information is provided on this After Visit Summary.  MyChart is used to connect with patients for Virtual Visits (Telemedicine).  Patients are able to view lab/test results, encounter notes, upcoming appointments, etc.  Non-urgent messages can be sent to your provider as well.   To learn more about what you can do with MyChart, go to [email or web address].   Other Instructions

## 2024-03-24 ENCOUNTER — Encounter: Payer: Self-pay | Admitting: Internal Medicine

## 2024-03-24 ENCOUNTER — Ambulatory Visit: Admitting: Internal Medicine

## 2024-03-24 VITALS — BP 104/74 | HR 76 | Ht 70.0 in | Wt 269.0 lb

## 2024-03-24 DIAGNOSIS — K648 Other hemorrhoids: Secondary | ICD-10-CM

## 2024-03-24 NOTE — Progress Notes (Signed)
° °  HEMORRHOID LIGATION:  INDICATION: Rectal bleeding  S/p banding RP and LL 01/18/24 - much better, occasional bleeding only  Rectal: small external hemorrhoids, mildly stenotic anal canal, + hemorrhoids  Anoscopy:  Gr 2 RA, Gr 1 RP/LL internal hemorrhoids all w/ some associated external components  PROCEDURE NOTE: The patient presents with symptomatic grade 2  hemorrhoids, requesting rubber band ligation of his/her hemorrhoidal disease.  All risks, benefits and alternative forms of therapy were described and informed consent was obtained.   The anorectum was pre-medicated with 5% lidocaine The decision was made to band the RA internal hemorrhoid, and the [facility]egan System was used to perform band ligation without complication.  Digital anorectal examination was then performed to assure proper positioning of the band, and to adjust the banded tissue as required.  The patient was discharged home without pain or other issues.  Dietary and behavioral recommendations were given and along with follow-up instructions.     The following adjunctive treatments were recommended:  Benefiber 1 tbsp qd  The patient will return as needed for  follow-up and possible additional banding as required. No complications were encountered and the patient tolerated the procedure well.

## 2024-03-24 NOTE — Patient Instructions (Signed)
 HEMORRHOID BANDING PROCEDURE    FOLLOW-UP CARE   The procedure you have had should have been relatively painless since the banding of the area involved does not have nerve endings and there is no pain sensation.  The rubber band cuts off the blood supply to the hemorrhoid and the band may fall off as soon as 48 hours after the banding (the band may occasionally be seen in the toilet bowl following a bowel movement). You may notice a temporary feeling of fullness in the rectum which should respond adequately to plain Tylenol  or Motrin.  Following the banding, avoid strenuous exercise that evening and resume full activity the next day.  A sitz bath (soaking in a warm tub) or bidet is soothing, and can be useful for cleansing the area after bowel movements.     To avoid constipation, take two tablespoons of natural wheat bran, natural oat bran, flax, Benefiber or any over the counter fiber supplement and increase your water intake to 7-8 glasses daily.    Unless you have been prescribed anorectal medication, do not put anything inside your rectum for two weeks: No suppositories, enemas, fingers, etc.  Occasionally, you may have more bleeding than usual after the banding procedure.  This is often from the untreated hemorrhoids rather than the treated one.  Dont be concerned if there is a tablespoon or so of blood.  If there is more blood than this, lie flat with your bottom higher than your head and apply an ice pack to the area. If the bleeding does not stop within a half an hour or if you feel faint, call our office at [phone] or go to the emergency room.  Problems are not common; however, if there is a substantial amount of bleeding, severe pain, chills, fever or difficulty passing urine (very rare) or other problems, you should call us  at [phone] or report to the nearest emergency room.  Do not stay seated continuously for more than 2-3 hours for a day or two after the procedure.   Tighten your buttock muscles 10-15 times every two hours and take 10-15 deep breaths every 1-2 hours.  Do not spend more than a few minutes on the toilet if you cannot empty your bowel; instead re-visit the toilet at a later time.   Follow up with us  as needed.   I appreciate the opportunity to care for you. Lupita Commander, MD, [facility]

## 2024-03-29 ENCOUNTER — Ambulatory Visit (HOSPITAL_COMMUNITY)
Admission: RE | Admit: 2024-03-29 | Discharge: 2024-03-29 | Attending: Cardiovascular Disease | Admitting: Cardiovascular Disease

## 2024-03-29 DIAGNOSIS — Z87891 Personal history of nicotine dependence: Secondary | ICD-10-CM | POA: Diagnosis not present

## 2024-03-29 DIAGNOSIS — E785 Hyperlipidemia, unspecified: Secondary | ICD-10-CM | POA: Diagnosis not present

## 2024-03-29 DIAGNOSIS — I119 Hypertensive heart disease without heart failure: Secondary | ICD-10-CM | POA: Insufficient documentation

## 2024-03-29 DIAGNOSIS — I358 Other nonrheumatic aortic valve disorders: Secondary | ICD-10-CM | POA: Insufficient documentation

## 2024-03-29 DIAGNOSIS — I517 Cardiomegaly: Secondary | ICD-10-CM

## 2024-03-29 LAB — ECHOCARDIOGRAM COMPLETE
Area-P 1/2: 3.33 cm2
S' Lateral: 2.1 cm

## 2024-03-31 ENCOUNTER — Ambulatory Visit: Payer: Self-pay | Admitting: Cardiovascular Disease

## 2024-04-03 NOTE — Progress Notes (Signed)
" °  Last read by Darrold LELON Hurst at 3:09PM on 04/03/2024.  "
